# Patient Record
Sex: Female | Born: 1965 | Race: White | Hispanic: No | Marital: Married | Smoking: Never smoker
Health system: Southern US, Community
[De-identification: ages and names within clinical notes are randomized; demographics above are authoritative.]

## PROBLEM LIST (undated history)

## (undated) DIAGNOSIS — E669 Obesity, unspecified: Secondary | ICD-10-CM

## (undated) DIAGNOSIS — H9319 Tinnitus, unspecified ear: Secondary | ICD-10-CM

## (undated) DIAGNOSIS — F419 Anxiety disorder, unspecified: Secondary | ICD-10-CM

## (undated) DIAGNOSIS — H9190 Unspecified hearing loss, unspecified ear: Secondary | ICD-10-CM

## (undated) DIAGNOSIS — H8090 Unspecified otosclerosis, unspecified ear: Secondary | ICD-10-CM

## (undated) HISTORY — DX: Obesity, unspecified: E66.9

## (undated) HISTORY — DX: Anxiety disorder, unspecified: F41.9

---
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
## 1998-01-20 ENCOUNTER — Other Ambulatory Visit: Admission: RE | Admit: 1998-01-20 | Discharge: 1998-01-20 | Payer: Self-pay | Admitting: Obstetrics & Gynecology

## 1998-03-09 ENCOUNTER — Emergency Department (HOSPITAL_COMMUNITY): Admission: EM | Admit: 1998-03-09 | Discharge: 1998-03-09 | Payer: Self-pay | Admitting: Emergency Medicine

## 1998-05-15 HISTORY — PX: CHOLECYSTECTOMY: SHX55

## 1999-02-07 ENCOUNTER — Other Ambulatory Visit: Admission: RE | Admit: 1999-02-07 | Discharge: 1999-02-07 | Payer: Self-pay | Admitting: Obstetrics and Gynecology

## 1999-02-28 ENCOUNTER — Ambulatory Visit (HOSPITAL_COMMUNITY): Admission: RE | Admit: 1999-02-28 | Discharge: 1999-03-01 | Payer: Self-pay | Admitting: Surgery

## 1999-02-28 ENCOUNTER — Encounter: Payer: Self-pay | Admitting: Surgery

## 2000-01-30 ENCOUNTER — Other Ambulatory Visit: Admission: RE | Admit: 2000-01-30 | Discharge: 2000-01-30 | Payer: Self-pay | Admitting: Obstetrics and Gynecology

## 2001-03-21 ENCOUNTER — Other Ambulatory Visit: Admission: RE | Admit: 2001-03-21 | Discharge: 2001-03-21 | Payer: Self-pay | Admitting: Obstetrics and Gynecology

## 2001-05-08 ENCOUNTER — Emergency Department (HOSPITAL_COMMUNITY): Admission: EM | Admit: 2001-05-08 | Discharge: 2001-05-08 | Payer: Self-pay | Admitting: *Deleted

## 2002-04-09 ENCOUNTER — Other Ambulatory Visit: Admission: RE | Admit: 2002-04-09 | Discharge: 2002-04-09 | Payer: Self-pay | Admitting: Obstetrics and Gynecology

## 2003-06-03 ENCOUNTER — Other Ambulatory Visit: Admission: RE | Admit: 2003-06-03 | Discharge: 2003-06-03 | Payer: Self-pay | Admitting: Obstetrics and Gynecology

## 2004-07-11 ENCOUNTER — Other Ambulatory Visit: Admission: RE | Admit: 2004-07-11 | Discharge: 2004-07-11 | Payer: Self-pay | Admitting: Obstetrics and Gynecology

## 2005-07-13 ENCOUNTER — Other Ambulatory Visit: Admission: RE | Admit: 2005-07-13 | Discharge: 2005-07-13 | Payer: Self-pay | Admitting: Obstetrics and Gynecology

## 2005-09-13 ENCOUNTER — Encounter: Payer: Self-pay | Admitting: Emergency Medicine

## 2015-10-12 DIAGNOSIS — J019 Acute sinusitis, unspecified: Secondary | ICD-10-CM | POA: Diagnosis not present

## 2015-10-12 DIAGNOSIS — H6691 Otitis media, unspecified, right ear: Secondary | ICD-10-CM | POA: Diagnosis not present

## 2015-10-12 DIAGNOSIS — R030 Elevated blood-pressure reading, without diagnosis of hypertension: Secondary | ICD-10-CM | POA: Diagnosis not present

## 2015-10-14 DIAGNOSIS — H9201 Otalgia, right ear: Secondary | ICD-10-CM | POA: Diagnosis not present

## 2015-11-17 DIAGNOSIS — M722 Plantar fascial fibromatosis: Secondary | ICD-10-CM | POA: Diagnosis not present

## 2015-11-17 DIAGNOSIS — M7732 Calcaneal spur, left foot: Secondary | ICD-10-CM | POA: Diagnosis not present

## 2015-11-17 DIAGNOSIS — M76822 Posterior tibial tendinitis, left leg: Secondary | ICD-10-CM | POA: Diagnosis not present

## 2015-11-17 DIAGNOSIS — M71572 Other bursitis, not elsewhere classified, left ankle and foot: Secondary | ICD-10-CM | POA: Diagnosis not present

## 2016-03-28 DIAGNOSIS — R05 Cough: Secondary | ICD-10-CM | POA: Diagnosis not present

## 2016-03-28 DIAGNOSIS — J019 Acute sinusitis, unspecified: Secondary | ICD-10-CM | POA: Diagnosis not present

## 2016-07-17 DIAGNOSIS — Z124 Encounter for screening for malignant neoplasm of cervix: Secondary | ICD-10-CM | POA: Diagnosis not present

## 2016-07-17 DIAGNOSIS — Z130 Encounter for screening for diseases of the blood and blood-forming organs and certain disorders involving the immune mechanism: Secondary | ICD-10-CM | POA: Diagnosis not present

## 2016-07-17 DIAGNOSIS — Z1151 Encounter for screening for human papillomavirus (HPV): Secondary | ICD-10-CM | POA: Diagnosis not present

## 2016-07-17 DIAGNOSIS — Z1231 Encounter for screening mammogram for malignant neoplasm of breast: Secondary | ICD-10-CM | POA: Diagnosis not present

## 2016-07-17 DIAGNOSIS — Z01419 Encounter for gynecological examination (general) (routine) without abnormal findings: Secondary | ICD-10-CM | POA: Diagnosis not present

## 2016-07-17 DIAGNOSIS — Z3041 Encounter for surveillance of contraceptive pills: Secondary | ICD-10-CM | POA: Diagnosis not present

## 2016-07-17 DIAGNOSIS — Z1389 Encounter for screening for other disorder: Secondary | ICD-10-CM | POA: Diagnosis not present

## 2016-07-17 DIAGNOSIS — Z6835 Body mass index (BMI) 35.0-35.9, adult: Secondary | ICD-10-CM | POA: Diagnosis not present

## 2016-07-17 LAB — HM PAP SMEAR: HM Pap smear: NEGATIVE

## 2016-07-17 LAB — HM MAMMOGRAPHY

## 2017-06-21 ENCOUNTER — Encounter: Payer: Self-pay | Admitting: Medical

## 2017-06-21 ENCOUNTER — Ambulatory Visit (INDEPENDENT_AMBULATORY_CARE_PROVIDER_SITE_OTHER): Payer: BLUE CROSS/BLUE SHIELD | Admitting: Medical

## 2017-06-21 VITALS — BP 130/70 | HR 95 | Wt 193.4 lb

## 2017-06-21 DIAGNOSIS — L509 Urticaria, unspecified: Secondary | ICD-10-CM | POA: Diagnosis not present

## 2017-06-21 DIAGNOSIS — G4700 Insomnia, unspecified: Secondary | ICD-10-CM | POA: Diagnosis not present

## 2017-06-21 DIAGNOSIS — Z566 Other physical and mental strain related to work: Secondary | ICD-10-CM

## 2017-06-21 LAB — CBC WITH DIFFERENTIAL/PLATELET
Basophils Absolute: 0 10*3/uL (ref 0.0–0.2)
Basos: 0 %
EOS (ABSOLUTE): 0.2 10*3/uL (ref 0.0–0.4)
Eos: 3 %
Hematocrit: 40 % (ref 34.0–46.6)
Hemoglobin: 13.2 g/dL (ref 11.1–15.9)
Immature Grans (Abs): 0 10*3/uL (ref 0.0–0.1)
Immature Granulocytes: 0 %
Lymphocytes Absolute: 2.2 10*3/uL (ref 0.7–3.1)
Lymphs: 24 %
MCH: 30.1 pg (ref 26.6–33.0)
MCHC: 33 g/dL (ref 31.5–35.7)
MCV: 91 fL (ref 79–97)
Monocytes Absolute: 0.8 10*3/uL (ref 0.1–0.9)
Monocytes: 8 %
Neutrophils Absolute: 6 10*3/uL (ref 1.4–7.0)
Neutrophils: 65 %
Platelets: 371 10*3/uL (ref 150–379)
RBC: 4.38 x10E6/uL (ref 3.77–5.28)
RDW: 13.6 % (ref 12.3–15.4)
WBC: 9.2 10*3/uL (ref 3.4–10.8)

## 2017-06-21 MED ORDER — HYDROXYZINE HCL 10 MG PO TABS: 10.0000 mg | ORAL_TABLET | Freq: Two times a day (BID) | ORAL | 0 refills | Status: DC

## 2017-06-21 MED ORDER — TRIAMCINOLONE ACETONIDE 0.1 % EX CREA: 1.0000 "application " | TOPICAL_CREAM | Freq: Two times a day (BID) | CUTANEOUS | 0 refills | Status: DC

## 2017-06-21 NOTE — Progress Notes (Signed)
Subjective:  Cathy Bartlett is a 52 y.o. female who presents as a new patient.  She sees primarily her gynecologist, Dr. Senaida Ores, [facility]  Here for complaint of rash. For the past month has rash that breaks out on forearms.  Looks like whelps.  She has pictures of this.  She has no current rash.  Been havcing these mainly at work, but most days, but not on weekend when home. 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  She denies any particular triggers such as cleaning supplies or dyes or foods that work is different than at home.  Has tried some cortisone cream topically.  Several years ago had litle white bumps on forearms, and was given a cream sample.   That cleared up the white bumps, but she tried it on the current rash and it hasn't cleared up.   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  She finds herself worrying a lot more work home, having trouble sleeping because she is thinking about work all the time.  No prior similar. No other aggravating or relieving factors.    No other c/o.  The following portions of the patient's history were reviewed and updated as appropriate: allergies, current medications, past family history, past medical history, past social history, past surgical history and problem list.  ROS Otherwise as in subjective above   Objective: BP 130/70   Pulse 95   Wt 193 lb 6.4 oz (87.7 kg)   LMP 06/20/2017   SpO2 96%   General appearence: alert, no distress, WD/WN Skin: right neck with small patch of macular pink/red rash 1cm diameter and linear patch of similar macular rash on right forearm, but she showed a picture on her phone of hives on her forearms from a few days ago HEENT: normocephalic, sclerae anicteric, conjunctiva pink and moist, TMs pearly, nares patent, no discharge or erythema, pharynx normal Oral cavity: MMM, no  lesions Neck: supple, no lymphadenopathy, no thyromegaly, no masses Heart: RRR, normal S1, S2, no murmurs Lungs: CTA bilaterally, no wheezes, rhonchi, or rales Pulses: 2+ radial pulses, 2+ pedal pulses, normal cap refill Ext: no edema   Assessment: Encounter Diagnoses  Name Primary?  . Hives Yes  . Work-related stress   . Insomnia, unspecified type     Plan: Hives Begin Hydroxyzine 1 tablet, 2-3 times daily for the next few weeks for rash and itching.  Avoid allergen or trigger if you suspect a certain trigger is causing the rash.  You can use Triamcinolone topical cream twice daily for 1-2 weeks at a time for rash.   If not much improved within the next 3- 5 days, then recheck or call.  If not completely resolved within 2 weeks, call or recheck.    Insomnia Discussed sleep hygiene measures including regular sleep schedule, optimal sleep environment, and relaxing presleep rituals.  Recommended daily exercise.  Discussed stimulus control, avoiding daytime naps, avoiding caffeine after noon, avoiding excess alcohol, avoiding food or drink <2 hours before bedtime.    Work-related stress We discussed ways to deal with stress and anxiety.  I recommend regular exercise such as 30 minutes or more most days of the week such as walking running and bicycling  I recommend taking some time to meditate or pray daily to help slow racing thoughts.  I recommend working on relaxation techniques such as deep breathing exercises in a  comfortable position relaxing your body.  There are free Apps on the smart phone for this for example  Consider getting a massage  Journal or use diary to express your ideas on paper to cope with anxiety and stress  Work on time management, use a calendar or plan out things to avoid stressing about things. Find ways to utilize your time to include exercise and personal "me" time.  Some people use aromatherapy such as lavender to relax  Some people use herbal teas to  help calm their mood  Spend some time with animals or your pet if you have one  Consider seeing a counselor to help deal with anxiety and work on specific techniques    Cathy Fermoina was seen today for new patient (initial visit).  Diagnoses and all orders for this visit:  Hives -     CBC with Differential/Platelet  Work-related stress -     CBC with Differential/Platelet  Insomnia, unspecified type  Other orders -     hydrOXYzine (ATARAX/VISTARIL) 10 MG tablet; Take 1 tablet (10 mg total) by mouth 2 (two) times daily. -     triamcinolone cream (KENALOG) 0.1 %; Apply 1 application topically 2 (two) times daily.   Follow up: 1wk

## 2017-06-21 NOTE — Patient Instructions (Signed)
Insomnia Insomnia is frequent trouble falling and/or staying asleep. Insomnia can be a long term problem or a short term problem. Both are common. Insomnia can be a short term problem when the wakefulness is related to a certain stress or worry. Long term insomnia is often related to ongoing stress during waking hours and/or poor sleeping habits. Overtime, sleep deprivation itself can make the problem worse. Every little thing feels more severe because you are overtired and your ability to cope is decreased.  CAUSES   Stress, anxiety, and depression.  Poor sleeping habits.  Distractions such as TV in the bedroom.  Naps close to bedtime.  Engaging in emotionally charged conversations before bed.  Technical reading before sleep.  Alcohol and other sedatives. They may make the problem worse. They can hurt normal sleep patterns and normal dream activity.  Stimulants such as caffeine for several hours prior to bedtime.  Pain syndromes and shortness of breath can cause insomnia.  Exercise late at night.  Changing time zones may cause sleeping problems (jet lag).  It is sometimes helpful to have someone observe your sleeping patterns. They should look for periods of not breathing during the night (sleep apnea). They should also look to see how long those periods last. If you live alone or observers are uncertain, you can also be observed at a sleep clinic where your sleep patterns will be professionally monitored. Sleep apnea requires a checkup and treatment. Give your caregivers your medical history. Give your caregivers observations your family has made about your sleep.   SYMPTOMS   Not feeling rested in the morning.  Anxiety and restlessness at bedtime.  Difficulty falling and staying asleep.  TREATMENT   Your caregiver may prescribe treatment for an underlying medical disorders. Your caregiver can give advice or help if you are using alcohol or other drugs for self-medication.  Treatment of underlying problems will usually eliminate insomnia problems.  Medications can be prescribed for short time use. They are generally not recommended for lengthy use.  Over-the-counter sleep medicines are not recommended for lengthy use. They can be habit forming.  You can promote easier sleeping by making lifestyle changes such as the following:  Sleep hygiene  Sleep only as much as you need to feel rested and then get out of bed  Keep a regular sleep schedule.  Aim to go to bed at the same time every night, and set an alarm clock to wake up at a fixed time each morning including weekends  Develop a bedtime ritual. Keep a familiar routine of bathing, brushing your teeth, climbing into bed at the same time each night, listening to soothing music. Routines increase the success of falling to sleep faster.  Use relaxation techniques. This can be using breathing and muscle tension release routines. It can also include visualizing peaceful scenes. You can also help control troubling or intruding thoughts by keeping your mind occupied with boring or repetitive thoughts like the old concept of counting sheep. You can make it more creative like imagining planting one beautiful flower after another in your backyard garden.  During your day, work to eliminate stress. When this is not possible use some of the previous suggestions to help reduce the anxiety that accompanies stressful situations.  Avoid forcing sleep  Exercise regularly at least 20 minutes, preferably 4-5 hours before bedtime  Avoid caffeinated beverages after lunch  Avoid alcohol near bedtime; no "night cap"  Avoid smoking, especially in the evening  Do not go to bed  hungry; work on [employer] Pacificchanging your diet and the time of your last meal. No night time snacks.  Adjust bedroom environment to a cool, quiet, dark room  Deal with you worries before bedtime.  Consider counseling for excessive stress, worry, and life situations.   I can provide resources and contact information for counselors if needed.  Stimulus control  Go to bed only when sleepy  Do not watch television, read, eat, or worry while in bed.  Use bed only for sleep and sex  Stop tedious detailed work at least one hour before bedtime.  Get out of the bed if unable to fall asleep within 20 minutes and go to another room.  Return to bed only when sleepy.  Read or do some quiet activity. Keep the lights down. Wait until you feel sleepy and go back to bed.Repeat this step as many times as necessary throughout the night  Do not take a nap during the day   SLEEP DIARY   Keep a diary. Inform your caregiver about your progress. This includes any medication side effects. See your caregiver regularly. Take note of:  Times when you are asleep.  Times when you are awake during the night.  The quality of your sleep.  How you feel the next day.  This information will help your caregiver care for you.  Bring your sleep diary in at the next visit      Hives Hives (urticaria) are itchy, red, swollen areas on your skin. Hives can show up on any part of your body, and they can vary in size. They can be as small as the tip of a pen or much larger. Hives often fade within 24 hours (acute hives). In other cases, new hives show up after old ones fade. This can continue for many days or weeks (chronic hives). Hives are caused by your body's reaction to an irritant or to something that you are allergic to (trigger). You can get hives right after being around a trigger or hours later. Hives do not spread from person to person (are not contagious). Hives may get worse if you scratch them, if you exercise, or if you have worries (emotional stress). Follow these instructions at home: Medicines  Take or apply over-the-counter and prescription medicines only as told by your doctor.  If you were prescribed an antibiotic medicine, use it as told by your doctor. Do not  stop taking the antibiotic even if you start to feel better. Skin Care  Apply cool, wet cloths (cool compresses) to the itchy, red, swollen areas.  Do not scratch your skin. Do not rub your skin. General instructions  Do not take hot showers or baths. This can make itching worse.  Do not wear tight clothes.  Use sunscreen and wear clothing that covers your skin when you are outside.  Avoid any triggers that cause your hives. Keep a journal to help you keep track of what causes your hives. Write down: ? What medicines you take. ? What you eat and drink. ? What products you use on your skin.  Keep all follow-up visits as told by your doctor. This is important. Contact a doctor if:  Your symptoms are not better with medicine.  Your joints are painful or swollen. Get help right away if:  You have a fever.  You have belly pain.  Your tongue or lips are swollen.  Your eyelids are swollen.  Your chest or throat feels tight.  You have trouble breathing or  swallowing. These symptoms may be an emergency. Do not wait to see if the symptoms will go away. Get medical help right away. Call your local emergency services (911 in the [place] Do not drive yourself to the hospital. This information is not intended to replace advice given to you by your health care provider. Make sure you discuss any questions you have with your health care provider. Document Released: 02/08/2008 Document Revised: 10/07/2015 Document Reviewed: 02/17/2015 Elsevier Interactive Patient Education  2018 [employer].

## 2017-06-21 NOTE — Assessment & Plan Note (Addendum)
Discussed sleep hygiene measures including regular sleep schedule, optimal sleep environment, and relaxing presleep rituals.  Recommended daily exercise.  Discussed stimulus control, avoiding daytime naps, avoiding caffeine after noon, avoiding excess alcohol, avoiding food or drink <2 hours before bedtime.    

## 2017-06-21 NOTE — Assessment & Plan Note (Addendum)
We discussed ways to deal with stress and anxiety. I recommend regular exercise such as 30 minutes or more most days of the week such as walking running and bicycling I recommend taking some time to meditate or pray daily to help slow racing thoughts. I recommend working on relaxation techniques such as deep breathing exercises in a comfortable position relaxing your body.  There are free Apps on the smart phone for this for example Consider getting a massage Journal or use diary to express your ideas on paper to cope with anxiety and stress Work on time management, use a calendar or plan out things to avoid stressing about things. Find ways to utilize your time to include exercise and personal "me" time. Some people use aromatherapy such as lavender to relax Some people use herbal teas to help calm their mood Spend some time with animals or your pet if you have one Consider seeing a counselor to help deal with anxiety and work on specific techniques 

## 2017-06-21 NOTE — Assessment & Plan Note (Signed)
Begin Hydroxyzine 1 tablet, 2-3 times daily for the next few weeks for rash and itching.  Avoid allergen or trigger if you suspect a certain trigger is causing the rash.  You can use Triamcinolone topical cream twice daily for 1-2 weeks at a time for rash.   If not much improved within the next 3- 5 days, then recheck or call.  If not completely resolved within 2 weeks, call or recheck.

## 2017-07-05 ENCOUNTER — Encounter: Payer: Self-pay | Admitting: Medical

## 2017-07-06 ENCOUNTER — Other Ambulatory Visit: Payer: Self-pay | Admitting: Medical

## 2017-07-06 MED ORDER — HYDROXYZINE HCL 10 MG PO TABS: 10.0000 mg | ORAL_TABLET | Freq: Two times a day (BID) | ORAL | 1 refills | Status: DC

## 2017-08-10 DIAGNOSIS — Z1231 Encounter for screening mammogram for malignant neoplasm of breast: Secondary | ICD-10-CM | POA: Diagnosis not present

## 2017-08-10 LAB — HM MAMMOGRAPHY

## 2017-08-21 DIAGNOSIS — Z1389 Encounter for screening for other disorder: Secondary | ICD-10-CM | POA: Diagnosis not present

## 2017-08-21 DIAGNOSIS — Z6833 Body mass index (BMI) 33.0-33.9, adult: Secondary | ICD-10-CM | POA: Diagnosis not present

## 2017-08-21 DIAGNOSIS — Z01419 Encounter for gynecological examination (general) (routine) without abnormal findings: Secondary | ICD-10-CM | POA: Diagnosis not present

## 2017-08-21 DIAGNOSIS — Z130 Encounter for screening for diseases of the blood and blood-forming organs and certain disorders involving the immune mechanism: Secondary | ICD-10-CM | POA: Diagnosis not present

## 2017-10-09 ENCOUNTER — Encounter: Payer: Self-pay | Admitting: Family Medicine

## 2017-10-09 ENCOUNTER — Ambulatory Visit (INDEPENDENT_AMBULATORY_CARE_PROVIDER_SITE_OTHER): Payer: BLUE CROSS/BLUE SHIELD | Admitting: Family Medicine

## 2017-10-09 VITALS — BP 122/86 | HR 98 | Temp 99.1°F | Ht 66.0 in | Wt 202.0 lb

## 2017-10-09 DIAGNOSIS — B349 Viral infection, unspecified: Secondary | ICD-10-CM

## 2017-10-09 NOTE — Progress Notes (Signed)
   Subjective:    Patient ID: Cathy Bartlett, female    DOB: 10-17-1965, 52 y.o.   MRN: [id number]  HPI She complains of a 1 day history of started with cough, headache, pain behind the eye, fever, chills , malaise and fatigue.  No sore throat earache, sneezing.   Review of Systems     Objective:   Physical Exam Alert and in no distress. Tympanic membranes and canals are normal. Pharyngeal area is normal. Neck is supple without adenopathy or thyromegaly. Cardiac exam shows a regular sinus rhythm without murmurs or gallops. Lungs are clear to auscultation. Flu test negative       Assessment & Plan:  Viral syndrome  Recommended treating with Advil or Aleve.  If her symptoms worsen in the next day or 2, she is to call.  This did hit relatively quickly so must also be concerned about pneumococcal disease.

## 2017-10-10 ENCOUNTER — Telehealth: Payer: Self-pay

## 2017-10-10 DIAGNOSIS — R05 Cough: Secondary | ICD-10-CM | POA: Diagnosis not present

## 2017-10-10 DIAGNOSIS — J019 Acute sinusitis, unspecified: Secondary | ICD-10-CM | POA: Diagnosis not present

## 2017-10-10 MED ORDER — AZITHROMYCIN 500 MG PO TABS: 500.0000 mg | ORAL_TABLET | Freq: Every day | ORAL | 0 refills | Status: DC

## 2017-10-10 NOTE — Telephone Encounter (Signed)
Pt has got the abx but say nyquil is not working. Just FYI. KH

## 2017-10-10 NOTE — Telephone Encounter (Signed)
Patient called states that she is worse, still has fever, cough is worse and she has not had any sleep.  Cough is making chest hurt and burn taking medicine every 4 hours with no relief.  Can you please send something else in to [facility].

## 2017-10-10 NOTE — Telephone Encounter (Signed)
I called an antibiotic in.  Make sure she is taking NyQuil at night

## 2018-02-20 ENCOUNTER — Ambulatory Visit (INDEPENDENT_AMBULATORY_CARE_PROVIDER_SITE_OTHER): Payer: BLUE CROSS/BLUE SHIELD | Admitting: Medical

## 2018-02-20 VITALS — BP 120/74 | HR 73 | Temp 98.1°F | Resp 16 | Ht 65.0 in | Wt 209.8 lb

## 2018-02-20 DIAGNOSIS — H68012 Acute Eustachian salpingitis, left ear: Secondary | ICD-10-CM

## 2018-02-20 DIAGNOSIS — Z7189 Other specified counseling: Secondary | ICD-10-CM

## 2018-02-20 DIAGNOSIS — Z1211 Encounter for screening for malignant neoplasm of colon: Secondary | ICD-10-CM

## 2018-02-20 DIAGNOSIS — Z7185 Encounter for immunization safety counseling: Secondary | ICD-10-CM

## 2018-02-20 DIAGNOSIS — H9312 Tinnitus, left ear: Secondary | ICD-10-CM | POA: Diagnosis not present

## 2018-02-20 NOTE — Progress Notes (Signed)
Subjective: Chief Complaint  Patient presents with  . ringing in ear    left ear ringing X 1 week   Here ringing in left ear x 1 week, wont stop. no recent injury or trauma.   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   Work in [employer].   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   No recent URI symptoms.  Having some sneezing, some head congestion.  Currently no cough, no sore, no runny nose, no ear pain.    No dizziness's.  No hearing loss.    No prior similar.   Using nothing for symptoms.  No other aggravating or relieving factors. No other complaint.  No past medical history on file. Current Outpatient Medications on File Prior to Visit  Medication Sig Dispense Refill  . hydrOXYzine (ATARAX/VISTARIL) 10 MG tablet Take 1 tablet (10 mg total) by mouth 2 (two) times daily. 45 tablet 1  . ibuprofen (ADVIL,MOTRIN) 100 MG tablet Take 100 mg by mouth every 6 (six) hours as needed for fever.    . Multiple Vitamin (MULTIVITAMIN) capsule Take 1 capsule by mouth daily.    Marland Kitchen thiamine (VITAMIN B-1) 100 MG tablet Take 100 mg by mouth daily.    Marland Kitchen triamcinolone cream (KENALOG) 0.1 % Apply 1 application topically 2 (two) times daily. (Patient not taking: Reported on 02/20/2018) 30 g 0  . VITAMIN D, CHOLECALCIFEROL, PO Take by mouth.     No current facility-administered medications on file prior to visit.    ROS as in subjective   Objective: BP 120/74   Pulse 73   Temp 98.1 F (36.7 C) (Oral)   Resp 16   Ht 5\' 5"  (1.651 m)   Wt 209 lb 12.8 oz (95.2 kg)   SpO2 97%   BMI 34.91 kg/m   General appearance: alert, no distress, WD/WN,  HEENT: normocephalic, sclerae anicteric, left TM with serous fluid behind the TM, right TM and canal normal,, nares patent, no discharge or erythema, pharynx normal Oral cavity: MMM, no lesions Neck: supple, no lymphadenopathy, no thyromegaly, no masses Heart: RRR, normal S1, S2, no murmurs Lungs: CTA bilaterally, no wheezes, rhonchi, or rales Pulses: 2+ symmetric,  upper and lower extremities, normal cap refill Neuro: CN II through XII intact, nonfocal exam   Assessment: Encounter Diagnoses  Name Primary?  . Tinnitus of left ear Yes  . Eustachian salpingitis, acute, left      Plan: Discussed exam findings and symptoms.  Advise she could either use Sudafed 1 or 2 times daily or Zyrtec daily for the next week, hydrate well, and if not much improved within 4 to 5 days let me know  Advised she return soon for a physical as we have not seen her for preventive care visit.  Advise she check insurance coverage for colonoscopy versus Cologuard and Shingrix vaccine  Cathy Bartlett was seen today for ringing in ear.  Diagnoses and all orders for this visit:  Tinnitus of left ear  Eustachian salpingitis, acute, left

## 2018-02-20 NOTE — Patient Instructions (Signed)
Consider Cologuard vs Colonoscopy, check insurance coverage for both  Shingles vaccine:  I recommend you have a shingles vaccine to help prevent shingles or herpes zoster outbreak.   Please call your insurer to inquire about coverage for the Shingrix vaccine given in 2 doses.   Some insurers cover this vaccine after age 50, some cover this after age 13.  If your insurer covers this, then call to schedule appointment to have this vaccine here.  Return soon for physical

## 2018-03-06 ENCOUNTER — Telehealth: Payer: Self-pay | Admitting: Medical

## 2018-03-06 ENCOUNTER — Other Ambulatory Visit: Payer: Self-pay

## 2018-03-06 DIAGNOSIS — H9312 Tinnitus, left ear: Secondary | ICD-10-CM

## 2018-03-06 NOTE — Telephone Encounter (Signed)
Refer to ear nose and throat specialist for chronic ringing in the ear

## 2018-03-06 NOTE — Telephone Encounter (Signed)
Done faxed notes to Dr Suszanne Conners

## 2018-03-21 DIAGNOSIS — J343 Hypertrophy of nasal turbinates: Secondary | ICD-10-CM | POA: Diagnosis not present

## 2018-03-21 DIAGNOSIS — H9042 Sensorineural hearing loss, unilateral, left ear, with unrestricted hearing on the contralateral side: Secondary | ICD-10-CM | POA: Diagnosis not present

## 2018-03-21 DIAGNOSIS — H9012 Conductive hearing loss, unilateral, left ear, with unrestricted hearing on the contralateral side: Secondary | ICD-10-CM | POA: Diagnosis not present

## 2018-03-21 DIAGNOSIS — H9312 Tinnitus, left ear: Secondary | ICD-10-CM | POA: Diagnosis not present

## 2018-03-21 DIAGNOSIS — H8092 Unspecified otosclerosis, left ear: Secondary | ICD-10-CM | POA: Diagnosis not present

## 2018-04-04 DIAGNOSIS — H90A32 Mixed conductive and sensorineural hearing loss, unilateral, left ear with restricted hearing on the contralateral side: Secondary | ICD-10-CM | POA: Diagnosis not present

## 2018-04-04 DIAGNOSIS — H90A21 Sensorineural hearing loss, unilateral, right ear, with restricted hearing on the contralateral side: Secondary | ICD-10-CM | POA: Diagnosis not present

## 2018-05-15 DIAGNOSIS — H9190 Unspecified hearing loss, unspecified ear: Secondary | ICD-10-CM

## 2018-05-15 DIAGNOSIS — H9319 Tinnitus, unspecified ear: Secondary | ICD-10-CM

## 2018-05-15 DIAGNOSIS — H8090 Unspecified otosclerosis, unspecified ear: Secondary | ICD-10-CM

## 2018-05-15 HISTORY — PX: INNER EAR SURGERY: SHX679

## 2018-05-15 HISTORY — DX: Unspecified hearing loss, unspecified ear: H91.90

## 2018-05-15 HISTORY — DX: Tinnitus, unspecified ear: H93.19

## 2018-05-15 HISTORY — DX: Unspecified otosclerosis, unspecified ear: H80.90

## 2018-05-24 ENCOUNTER — Other Ambulatory Visit: Payer: Self-pay | Admitting: Otolaryngology

## 2019-01-13 ENCOUNTER — Encounter: Payer: Self-pay | Admitting: Medical

## 2019-01-13 ENCOUNTER — Other Ambulatory Visit: Payer: Self-pay

## 2019-01-13 ENCOUNTER — Ambulatory Visit (INDEPENDENT_AMBULATORY_CARE_PROVIDER_SITE_OTHER): Payer: 59 | Admitting: Medical

## 2019-01-13 VITALS — BP 120/70 | HR 84 | Temp 98.1°F | Ht 65.0 in | Wt 216.8 lb

## 2019-01-13 DIAGNOSIS — Z23 Encounter for immunization: Secondary | ICD-10-CM | POA: Insufficient documentation

## 2019-01-13 DIAGNOSIS — Z7185 Encounter for immunization safety counseling: Secondary | ICD-10-CM

## 2019-01-13 DIAGNOSIS — E669 Obesity, unspecified: Secondary | ICD-10-CM | POA: Diagnosis not present

## 2019-01-13 DIAGNOSIS — Z7189 Other specified counseling: Secondary | ICD-10-CM

## 2019-01-13 DIAGNOSIS — Z0000 Encounter for general adult medical examination without abnormal findings: Secondary | ICD-10-CM | POA: Diagnosis not present

## 2019-01-13 DIAGNOSIS — F430 Acute stress reaction: Secondary | ICD-10-CM | POA: Insufficient documentation

## 2019-01-13 DIAGNOSIS — Z8249 Family history of ischemic heart disease and other diseases of the circulatory system: Secondary | ICD-10-CM | POA: Diagnosis not present

## 2019-01-13 DIAGNOSIS — L509 Urticaria, unspecified: Secondary | ICD-10-CM

## 2019-01-13 DIAGNOSIS — H9190 Unspecified hearing loss, unspecified ear: Secondary | ICD-10-CM

## 2019-01-13 DIAGNOSIS — H8090 Unspecified otosclerosis, unspecified ear: Secondary | ICD-10-CM

## 2019-01-13 DIAGNOSIS — Z1211 Encounter for screening for malignant neoplasm of colon: Secondary | ICD-10-CM | POA: Diagnosis not present

## 2019-01-13 NOTE — Patient Instructions (Signed)
Thanks for trusting us with your health care and for coming in for a physical today.  Below are some general recommendations I have for you:  Yearly screenings See your eye doctor yearly for routine vision care. See your dentist yearly for routine dental care including hygiene visits twice yearly. See me here yearly for a routine physical and preventative care visit   Cancer screening Colon cancer screening:   We will refer you for screening colonoscopy   Breast cancer screening -  Continue plan for yearly mammogram   Osteoporosis screening/Bone Density test - I recommend a bone density test if you are over 51 years old, or under 51 years old if you have risk factors such as a bone fracture as an adult, parent with history of bone fracture as adult, thyroid disease, early menopause, or underweight for example.   Specific Concerns today:  . We will call with lab results . I recommend a low carb health diet, exercise at least 150 minutes per week    Please follow up yearly for a physical.    I have included other useful information below for your review.  Preventative Care for Adults - Female      MAINTAIN REGULAR HEALTH EXAMS:  A routine yearly physical is a good way to check in with your primary care provider about your health and preventive screening. It is also an opportunity to share updates about your health and any concerns you have, and receive a thorough all-over exam.   Most health insurance companies pay for at least some preventative services.  Check with your health plan for specific coverages.  WHAT PREVENTATIVE SERVICES DO WOMEN NEED?  Adult women should have their weight and blood pressure checked regularly.   Women age 51 and older should have their cholesterol levels checked regularly.  Women should be screened for cervical cancer with a Pap smear and pelvic exam beginning at either age 51, or 3 years after they become sexually activity.    Breast  cancer screening generally begins at age 51 with a mammogram and breast exam by your primary care provider.    Beginning at age 51 and continuing to age 51, women should be screened for colorectal cancer.  Certain people may need continued testing until age 51.  Updating vaccinations is part of preventative care.  Vaccinations help protect against diseases such as the flu.  Osteoporosis is a disease in which the bones lose minerals and strength as we age. Women ages 665 and over should discuss this with their caregivers, as should women after menopause who have other risk factors.  Lab tests are generally done as part of preventative care to screen for anemia and blood disorders, to screen for problems with the kidneys and liver, to screen for bladder problems, to check blood sugar, and to check your cholesterol level.  Preventative services generally include counseling about diet, exercise, avoiding tobacco, drugs, excessive alcohol consumption, and sexually transmitted infections.    GENERAL RECOMMENDATIONS FOR GOOD HEALTH:  Healthy diet:  Eat a variety of foods, including fruit, vegetables, animal or vegetable protein, such as meat, fish, chicken, and eggs, or beans, lentils, tofu, and grains, such as rice.  Drink plenty of water daily.  Decrease saturated fat in the diet, avoid lots of red meat, processed foods, sweets, fast foods, and fried foods.  Exercise:  Aerobic exercise helps maintain good heart health. At least 30-40 minutes of moderate-intensity exercise is recommended. For example, a brisk walk that increases  your heart rate and breathing. This should be done on most days of the week.   Find a type of exercise or a variety of exercises that you enjoy so that it becomes a part of your daily life.  Examples are running, walking, swimming, water aerobics, and biking.  For motivation and support, explore group exercise such as aerobic class, spin class, Zumba, Yoga,or  martial  arts, etc.    Set exercise goals for yourself, such as a certain weight goal, walk or run in a race such as a 5k walk/run.  Speak to your primary care provider about exercise goals.  Disease prevention:  If you smoke or chew tobacco, find out from your caregiver how to quit. It can literally save your life, no matter how long you have been a tobacco user. If you do not use tobacco, never begin.   Maintain a healthy diet and normal weight. Increased weight leads to problems with blood pressure and diabetes.   The Body Mass Index or BMI is a way of measuring how much of your body is fat. Having a BMI above 27 increases the risk of heart disease, diabetes, hypertension, stroke and other problems related to obesity. Your caregiver can help determine your BMI and based on it develop an exercise and dietary program to help you achieve or maintain this important measurement at a healthful level.  High blood pressure causes heart and blood vessel problems.  Persistent high blood pressure should be treated with medicine if weight loss and exercise do not work.   Fat and cholesterol leaves deposits in your arteries that can block them. This causes heart disease and vessel disease elsewhere in your body.  If your cholesterol is found to be high, or if you have heart disease or certain other medical conditions, then you may need to have your cholesterol monitored frequently and be treated with medication.   Ask if you should have a cardiac stress test if your history suggests this. A stress test is a test done on a treadmill that looks for heart disease. This test can find disease prior to there being a problem.  Menopause can be associated with physical symptoms and risks. Hormone replacement therapy is available to decrease these. You should talk to your caregiver about whether starting or continuing to take hormones is right for you.   Osteoporosis is a disease in which the bones lose minerals and  strength as we age. This can result in serious bone fractures. Risk of osteoporosis can be identified using a bone density scan. Women ages 71 and over should discuss this with their caregivers, as should women after menopause who have other risk factors. Ask your caregiver whether you should be taking a calcium supplement and Vitamin D, to reduce the rate of osteoporosis.   Avoid drinking alcohol in excess (more than two drinks per day).  Avoid use of street drugs. Do not share needles with anyone. Ask for professional help if you need assistance or instructions on stopping the use of alcohol, cigarettes, and/or drugs.  Brush your teeth twice a day with fluoride toothpaste, and floss once a day. Good oral hygiene prevents tooth decay and gum disease. The problems can be painful, unattractive, and can cause other health problems. Visit your dentist for a routine oral and dental check up and preventive care every 6-12 months.   Look at your skin regularly.  Use a mirror to look at your back. Notify your caregivers of changes in moles,  especially if there are changes in shapes, colors, a size larger than a pencil eraser, an irregular border, or development of new moles.  Safety:  Use seatbelts 100% of the time, whether driving or as a passenger.  Use safety devices such as hearing protection if you work in environments with loud noise or significant background noise.  Use safety glasses when doing any work that could send debris in to the eyes.  Use a helmet if you ride a bike or motorcycle.  Use appropriate safety gear for contact sports.  Talk to your caregiver about gun safety.  Use sunscreen with a SPF (or skin protection factor) of 15 or greater.  Lighter skinned people are at a greater risk of skin cancer. Don't forget to also wear sunglasses in order to protect your eyes from too much damaging sunlight. Damaging sunlight can accelerate cataract formation.   Practice safe sex. Use condoms. Condoms  are used for birth control and to help reduce the spread of sexually transmitted infections (or STIs).  Some of the STIs are gonorrhea (the clap), chlamydia, syphilis, trichomonas, herpes, HPV (human papilloma virus) and HIV (human immunodeficiency virus) which causes AIDS. The herpes, HIV and HPV are viral illnesses that have no cure. These can result in disability, cancer and death.   Keep carbon monoxide and smoke detectors in your home functioning at all times. Change the batteries every 6 months or use a model that plugs into the wall.   Vaccinations:  Stay up to date with your tetanus shots and other required immunizations. You should have a booster for tetanus every 10 years. Be sure to get your flu shot every year, since 5%-20% of the [place] population comes down with the flu. The flu vaccine changes each year, so being vaccinated once is not enough. Get your shot in the fall, before the flu season peaks.   Other vaccines to consider:  Human Papilloma Virus or HPV causes cancer of the cervix, and other infections that can be transmitted from person to person. There is a vaccine for HPV, and females should get immunized between the ages of 29 and 35. It requires a series of 3 shots.   Pneumococcal vaccine to protect against certain types of pneumonia.  This is normally recommended for adults age 20 or older.  However, adults younger than 51 years old with certain underlying conditions such as diabetes, heart or lung disease should also receive the vaccine.  Shingles vaccine to protect against Varicella Zoster if you are older than age 85, or younger than 51 years old with certain underlying illness.  If you have not had the Shingrix vaccine, please call your insurer to inquire about coverage for the Shingrix vaccine given in 2 doses.   Some insurers cover this vaccine after age 5, some cover this after age 61.  If your insurer covers this, then call to schedule appointment to have this vaccine  here  Hepatitis A vaccine to protect against a form of infection of the liver by a virus acquired from food.  Hepatitis B vaccine to protect against a form of infection of the liver by a virus acquired from blood or body fluids, particularly if you work in health care.  If you plan to travel internationally, check with your local health department for specific vaccination recommendations.  Cancer Screening:  Breast cancer screening is essential to preventive care for women. All women age 41 and older should perform a breast self-exam every month. At age 21  and older, women should have their caregiver complete a breast exam each year. Women at ages 6240 and older should have a mammogram (x-ray film) of the breasts. Your caregiver can discuss how often you need mammograms.    Cervical cancer screening includes taking a Pap smear (sample of cells examined under a microscope) from the cervix (end of the uterus). It also includes testing for HPV (Human Papilloma Virus, which can cause cervical cancer). Screening and a pelvic exam should begin at age 51, or 3 years after a woman becomes sexually active. Screening should occur every year, with a Pap smear but no HPV testing, up to age 230. After age 51, you should have a Pap smear every 3 years with HPV testing, if no HPV was found previously.   Most routine colon cancer screening begins at the age of 51. On a yearly basis, doctors may provide special easy to use take-home tests to check for hidden blood in the stool. Sigmoidoscopy or colonoscopy can detect the earliest forms of colon cancer and is life saving. These tests use a small camera at the end of a tube to directly examine the colon. Speak to your caregiver about this at age 51, when routine screening begins (and is repeated every 5 years unless early forms of pre-cancerous polyps or small growths are found).

## 2019-01-13 NOTE — Progress Notes (Signed)
Subjective:   HPI  Cathy Bartlett is a 53 y.o. female who presents for Chief Complaint  Patient presents with  . Annual Exam    with fasting labs     Patient Care Team: Tysinger, Cleda Mccreedyavid S, PA-C as PCP - General (Family Medicine) Sees dentist Sees eye doctor Dr[facility] Huel CoteKathy Richardson, [facility]. 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    Been under a lot of stress.  Very emotional lately.    Itching everywhere, has hx/o hives.    Reviewed their medical, surgical, family, social, medication, and allergy history and updated chart as appropriate.  Past Medical History:  Diagnosis Date  . Hearing loss 2020  . Obese   . 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 Tinnitus 2020    Past Surgical History:  Procedure Laterality Date  . CHOLECYSTECTOMY  2000  . INNER EAR SURGERY  05/2018   tinnitus, hearing loss, due to otosclerosis; Dr. Christia Readingwight Bates    Social History   Socioeconomic History  . Marital status: Married    Spouse name: Not on file  . Number of children: Not on file  . Years of education: Not on file  . Highest education level: Not on file  Occupational History  . Not on file  Social Needs  . Financial resource strain: Not on file  . Food insecurity    Worry: Not on file    Inability: Not on file  . Transportation needs    Medical: Not on file    Non-medical: Not on file  Tobacco Use  . Smoking status: Never Smoker  . Smokeless tobacco: Never Used  Substance and Sexual Activity  . Alcohol use: No    Frequency: Never  . Drug use: Not on file  . Sexual activity: Not on file  Lifestyle  . Physical activity    Days per week: Not on file    Minutes per session: Not on file  . Stress: Not on file  Relationships  . Social Musicianconnections    Talks on phone: Not on file    Gets together: Not on file    Attends religious service: Not on file    Active member of club or organization: Not on file    Attends  meetings of clubs or organizations: Not on file    Relationship status: Not on file  . Intimate partner violence    Fear of current or ex partner: Not on file    Emotionally abused: Not on file    Physically abused: Not on file    Forced sexual activity: Not on file  Other Topics Concern  . Not on file  Social History Narrative   Lives with husband Onalee HuaDavid, patient of mine as well, no children.   Works as Occupational psychologistfulfillment supervisor.   Exercise - some yoga, walking.   12/2018    Family History  Problem Relation Age of Onset  . Hypertension Mother   . Diabetes Mother   . Dementia Mother        died of dementia  . Heart disease Mother [id number]       MI shortly after husband died  . Heart disease Father [id number]       multiple MIs, CABG  . Hypertension Father   . Stroke Father   . Diabetes Father   . Diabetes Sister   . Supraventricular tachycardia Sister   . Cancer Maternal Aunt  breast  . Cancer Maternal Grandfather   . Cancer Maternal Aunt        breast     Current Outpatient Medications:  .  ibuprofen (ADVIL,MOTRIN) 100 MG tablet, Take 100 mg by mouth every 6 (six) hours as needed for fever., Disp: , Rfl:  .  Multiple Vitamin (MULTIVITAMIN) capsule, Take 1 capsule by mouth daily., Disp: , Rfl:  .  thiamine (VITAMIN B-1) 100 MG tablet, Take 100 mg by mouth daily., Disp: , Rfl:  .  triamcinolone cream (KENALOG) 0.1 %, Apply 1 application topically 2 (two) times daily., Disp: 30 g, Rfl: 0 .  hydrOXYzine (ATARAX/VISTARIL) 10 MG tablet, Take 1 tablet (10 mg total) by mouth 2 (two) times daily. (Patient not taking: Reported on 01/13/2019), Disp: 45 tablet, Rfl: 1 .  VITAMIN D, CHOLECALCIFEROL, PO, Take by mouth., Disp: , Rfl:   No Known Allergies    Review of Systems Constitutional: -fever, -chills, -sweats, -unexpected weight change, -decreased appetite, -fatigue Allergy: -sneezing, -itching, -congestion Dermatology: -changing moles, +rash, -lumps ENT: -runny nose, -ear pain, -sore  throat, -hoarseness, -sinus pain, -teeth pain, - ringing in ears, -hearing loss, -nosebleeds Cardiology: -chest pain, -palpitations, -swelling, -difficulty breathing when lying flat, -waking up short of breath Respiratory: -cough, -shortness of breath, -difficulty breathing with exercise or exertion, -wheezing, -coughing up blood Gastroenterology: -abdominal pain, -nausea, -vomiting, -diarrhea, -constipation, -blood in stool, -changes in bowel movement, -difficulty swallowing or eating Hematology: -bleeding, -bruising  Musculoskeletal: -joint aches, -muscle aches, -joint swelling, -back pain, -neck pain, -cramping, -changes in gait Ophthalmology: denies vision changes, eye redness, itching, discharge Urology: -burning with urination, -difficulty urinating, -blood in urine, -urinary frequency, -urgency, -incontinence Neurology: -headache, -weakness, -tingling, -numbness, -memory loss, -falls, -dizziness Breast/gyn: -breast tendnerss, -discharge, -lumps, -vaginal discharge,- irregular periods, -heavy periods      Objective:  BP 120/70   Pulse 84   Temp 98.1 F (36.7 C) (Oral)   Ht 5\' 5"  (1.651 m)   Wt 216 lb 12.8 oz (98.3 kg)   SpO2 98%   BMI 36.08 kg/m   General appearance: alert, no distress, WD/WN, Caucasian female Skin: large flat brown macule upper mid back 1.3 cm diameter, palpation suggest some thickness or fullness subcutaneous to the skin, other scattered macules, no other worrisome lesions HEENT: normocephalic, conjunctiva/corneas normal, sclerae anicteric, PERRLA, EOMi, nares patent, no discharge or erythema, pharynx normal Oral cavity: MMM, tongue normal, teeth normal Neck: supple, no lymphadenopathy, no thyromegaly, no masses, normal ROM, no bruits Chest: non tender, normal shape and expansion Heart: RRR, normal S1, S2, no murmurs Lungs: CTA bilaterally, no wheezes, rhonchi, or rales Abdomen: +bs, soft, non tender, non distended, no masses, no hepatomegaly, no splenomegaly,  no bruits Back: non tender, normal ROM, no scoliosis Musculoskeletal: upper extremities non tender, no obvious deformity, normal ROM throughout, lower extremities non tender, no obvious deformity, normal ROM throughout Extremities: no edema, no cyanosis, no clubbing Pulses: 2+ symmetric, upper and lower extremities, normal cap refill Neurological: alert, oriented x 3, CN2-12 intact, strength normal upper extremities and lower extremities, sensation normal throughout, DTRs 2+ throughout, no cerebellar signs, gait normal Psychiatric: normal affect, behavior normal, pleasant  Breast/gyn/rectal - deferred to gynecology   EKG Indication physical, family history of premature heart disease in father, rate 91 bpm, PR interval 110 ms, QRS 82 ms, QTC 455 ms, axis VI degrees, sinus rhythm with short PR interval   Assessment and Plan :   Encounter Diagnoses  Name Primary?  . Encounter for health maintenance examination  in adult Yes  . Family history of premature CAD   . Obesity, unspecified classification, unspecified obesity type, unspecified whether serious comorbidity present   . Screen for colon cancer   . Vaccine counseling   . Otosclerosis, unspecified laterality   . Hearing loss, unspecified hearing loss type, unspecified laterality   . Need for influenza vaccination   . Need for Tdap vaccination   . Hives   . Acute stress reaction     Physical exam - discussed and counseled on healthy lifestyle, diet, exercise, preventative care, vaccinations, sick and well care, proper use of emergency dept and after hours care, and addressed their concerns.    Health screening: Advised they see their eye doctor yearly for routine vision care. Advised they see their dentist yearly for routine dental care including hygiene visits twice yearly.  Cancer screening Counseled on self breast exams, mammograms, cervical cancer screening  Colonoscopy:  Referred for screening  colonoscopy  Vaccinations: Advised yearly influenza vaccine Counseled on the influenza virus vaccine.  Vaccine information sheet given.  Influenza vaccine given after consent obtained.  Counseled on the Tdap (tetanus, diptheria, and acellular pertussis) vaccine.  Vaccine information sheet given. Tdap vaccine given after consent obtained.   Separate significant chronic issues discussed: Hives - likely stress related  Acute stress reaction - discussed ways to deal with stress.  Consider counseling.   She may request sleep aid, having some problems with sleep.  Skin lesions - advised baseline dermatology consult.  She will let me know.  Otosclerosis - reviewed ENT notes from earlier this year  Obesity - counseled on need to work on weight loss, healthy diet   Allisson was seen today for annual exam.  Diagnoses and all orders for this visit:  Encounter for health maintenance examination in adult -     Comprehensive metabolic panel -     CBC with Differential/Platelet -     Lipid panel -     TSH -     VITAMIN D 25 Hydroxy (Vit-D Deficiency, Fractures) -     EKG 12-Lead -     Ambulatory referral to Gastroenterology  Family history of premature CAD -     EKG 12-Lead  Obesity, unspecified classification, unspecified obesity type, unspecified whether serious comorbidity present  Screen for colon cancer -     Ambulatory referral to Gastroenterology  Vaccine counseling  Otosclerosis, unspecified laterality  Hearing loss, unspecified hearing loss type, unspecified laterality  Need for influenza vaccination  Need for Tdap vaccination  Hives  Acute stress reaction  Other orders -     Tdap vaccine greater than or equal to 7yo IM -     Flu Vaccine QUAD 6+ mos PF IM (Fluarix Quad PF)    Follow-up pending labs, yearly for physical

## 2019-01-14 ENCOUNTER — Other Ambulatory Visit: Payer: Self-pay | Admitting: Medical

## 2019-01-14 LAB — CBC WITH DIFFERENTIAL/PLATELET
Basophils Absolute: 0.1 10*3/uL (ref 0.0–0.2)
Basos: 1 %
EOS (ABSOLUTE): 0.3 10*3/uL (ref 0.0–0.4)
Eos: 3 %
Hematocrit: 39.8 % (ref 34.0–46.6)
Hemoglobin: 13 g/dL (ref 11.1–15.9)
Immature Grans (Abs): 0 10*3/uL (ref 0.0–0.1)
Immature Granulocytes: 0 %
Lymphocytes Absolute: 2.7 10*3/uL (ref 0.7–3.1)
Lymphs: 24 %
MCH: 29.8 pg (ref 26.6–33.0)
MCHC: 32.7 g/dL (ref 31.5–35.7)
MCV: 91 fL (ref 79–97)
Monocytes Absolute: 0.7 10*3/uL (ref 0.1–0.9)
Monocytes: 6 %
Neutrophils Absolute: 7.5 10*3/uL — ABNORMAL HIGH (ref 1.4–7.0)
Neutrophils: 66 %
Platelets: 315 10*3/uL (ref 150–450)
RBC: 4.36 x10E6/uL (ref 3.77–5.28)
RDW: 12.6 % (ref 11.7–15.4)
WBC: 11.3 10*3/uL — ABNORMAL HIGH (ref 3.4–10.8)

## 2019-01-14 LAB — COMPREHENSIVE METABOLIC PANEL
ALT: 18 IU/L (ref 0–32)
AST: 19 IU/L (ref 0–40)
Albumin/Globulin Ratio: 1.5 (ref 1.2–2.2)
Albumin: 4.5 g/dL (ref 3.8–4.8)
Alkaline Phosphatase: 85 IU/L (ref 39–117)
BUN/Creatinine Ratio: 14 (ref 9–23)
BUN: 10 mg/dL (ref 6–24)
Bilirubin Total: 0.3 mg/dL (ref 0.0–1.2)
CO2: 21 mmol/L (ref 20–29)
Calcium: 9.4 mg/dL (ref 8.7–10.2)
Chloride: 102 mmol/L (ref 96–106)
Creatinine, Ser: 0.69 mg/dL (ref 0.57–1.00)
GFR calc Af Amer: 117 mL/min/{1.73_m2} (ref >59)
GFR calc non Af Amer: 102 mL/min/{1.73_m2} (ref >59)
Globulin, Total: 3.1 g/dL (ref 1.5–4.5)
Glucose: 99 mg/dL (ref 65–99)
Potassium: 4.7 mmol/L (ref 3.5–5.2)
Sodium: 138 mmol/L (ref 134–144)
Total Protein: 7.6 g/dL (ref 6.0–8.5)

## 2019-01-14 LAB — LIPID PANEL
Chol/HDL Ratio: 4.2 ratio (ref 0.0–4.4)
Cholesterol, Total: 222 mg/dL — ABNORMAL HIGH (ref 100–199)
HDL: 53 mg/dL (ref >39)
LDL Chol Calc (NIH): 152 mg/dL — ABNORMAL HIGH (ref 0–99)
Triglycerides: 95 mg/dL (ref 0–149)
VLDL Cholesterol Cal: 17 mg/dL (ref 5–40)

## 2019-01-14 LAB — VITAMIN D 25 HYDROXY (VIT D DEFICIENCY, FRACTURES): Vit D, 25-Hydroxy: 27.7 ng/mL — ABNORMAL LOW (ref 30.0–100.0)

## 2019-01-14 LAB — TSH: TSH: 3.96 u[IU]/mL (ref 0.450–4.500)

## 2019-01-14 MED ORDER — VITAMIN D 25 MCG (1000 UNIT) PO TABS: 1000.0000 [IU] | ORAL_TABLET | Freq: Every day | ORAL | 3 refills | Status: DC

## 2019-01-30 ENCOUNTER — Telehealth: Payer: Self-pay | Admitting: Medical

## 2019-01-30 NOTE — Telephone Encounter (Signed)
Requested records from [facility] received.

## 2019-02-04 ENCOUNTER — Encounter: Payer: Self-pay | Admitting: Medical

## 2019-02-12 ENCOUNTER — Ambulatory Visit (INDEPENDENT_AMBULATORY_CARE_PROVIDER_SITE_OTHER): Payer: 59 | Admitting: Medical

## 2019-02-12 ENCOUNTER — Other Ambulatory Visit: Payer: Self-pay

## 2019-02-12 ENCOUNTER — Encounter: Payer: Self-pay | Admitting: Medical

## 2019-02-12 VITALS — BP 110/78 | HR 68 | Temp 97.8°F | Ht 65.0 in | Wt 207.0 lb

## 2019-02-12 DIAGNOSIS — Z566 Other physical and mental strain related to work: Secondary | ICD-10-CM | POA: Insufficient documentation

## 2019-02-12 DIAGNOSIS — L509 Urticaria, unspecified: Secondary | ICD-10-CM | POA: Diagnosis not present

## 2019-02-12 MED ORDER — HYDROXYZINE HCL 10 MG PO TABS: 10.0000 mg | ORAL_TABLET | Freq: Two times a day (BID) | ORAL | 3 refills | Status: DC

## 2019-02-12 NOTE — Progress Notes (Signed)
Subjective: Chief Complaint  Patient presents with  . Urticaria    were on hands last night-gone    Here for hives.  She notes a history of hives.  She was seen here back in 2019 for hives.  However she has had problems with this on a regular basis in recent months.  Yesterday started having itching on the left hand, and then shortly thereafter hives popped up.  She used steroid cream that she had at home and oral Benadryl.  This helped calm down the itching and redness.  She notes frequent episodes like this in recent weeks.  It does seem to be more prominent at work.  She denies any specific trigger other than possibly stress.  She notes from a recent discussion at her physical that she is working a job of 3 people right now since they let some people go.  She does not take Benadryl or anything like that regularly except what flareup.  At home she has a dog but no other animals or pets.  She does not think any specific item she is around is causing this although she does note one time her husband used some carpet cleaning material and she broke out had the toe.  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  She is a Librarian, academic for procurement specialist.  No fever, no recent weight changes, no night sweats.  She has an appointment coming up for her Pap smear mammogram with gynecology.  She has her consult for first colonoscopy on November 4.  No swollen lymph nodes.  no other aggravating or relieving factors. No other complaint.   Past Medical History:  Diagnosis Date  . Hearing loss 2020  . Obese   . 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 Tinnitus 2020   Current Outpatient Medications on File Prior to Visit  Medication Sig Dispense Refill  . cholecalciferol (VITAMIN D3) 25 MCG (1000 UT) tablet Take 1 tablet (1,000 Units total) by mouth daily. 90 tablet 3  . ibuprofen (ADVIL,MOTRIN) 100 MG tablet Take 100 mg by mouth every 6 (six) hours as needed for fever.    . Multiple Vitamin (MULTIVITAMIN)  capsule Take 1 capsule by mouth daily.    Marland Kitchen triamcinolone cream (KENALOG) 0.1 % Apply 1 application topically 2 (two) times daily. 30 g 0   No current facility-administered medications on file prior to visit.    ROS as in subjective   Objective: BP 110/78   Pulse 68   Temp 97.8 F (36.6 C)   Ht 5\' 5"  (1.651 m)   Wt 207 lb (93.9 kg)   SpO2 97%   BMI 34.45 kg/m    Gen: wd, wn, nad No abnormality of skin today, no lymphadenopathy, no obvious hives particularly on the hands where she had involvement yesterday   Assessment: Encounter Diagnoses  Name Primary?  . Hives Yes  . Stress at work      Plan: We discussed her symptoms.  We discussed the possibility that stress could be triggering some of this.  She also is around cardboard boxes handling those throughout the day.  This could be a possible allergen trigger.  She will use the hydroxyzine below which has helped in the past.  I advised symptom diary.  We discussed the numerous possible things that can trigger hives.  She is in the process of updating her cancer screens in the next month including Pap smear, mammogram through gynecology as well as colonoscopy.  We discussed other testing often  is done including HIV test, labs, allergy testing.  She declines HIV test.  We just recently did labs with her physical and her CBC was slightly elevated for the white counts.  We will plan to repeat this in the next month depending on her call back.  We discussed the referral to allergist which would include some labs and allergy testing but she wants to try more conservative approach first.    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  Call report 1 to 2-week   Xoey was seen today for urticaria.  Diagnoses and all orders for this visit:  Hives  Stress at work  Other orders -     hydrOXYzine  (ATARAX/VISTARIL) 10 MG tablet; Take 1 tablet (10 mg total) by mouth 2 (two) times daily.

## 2019-02-14 ENCOUNTER — Encounter: Payer: Self-pay | Admitting: Medical

## 2019-04-15 HISTORY — PX: COLONOSCOPY: SHX174

## 2019-04-23 LAB — HM COLONOSCOPY

## 2019-05-30 ENCOUNTER — Encounter: Payer: Self-pay | Admitting: Medical

## 2019-09-15 DIAGNOSIS — Z6830 Body mass index (BMI) 30.0-30.9, adult: Secondary | ICD-10-CM | POA: Diagnosis not present

## 2019-09-15 DIAGNOSIS — Z130 Encounter for screening for diseases of the blood and blood-forming organs and certain disorders involving the immune mechanism: Secondary | ICD-10-CM | POA: Diagnosis not present

## 2019-09-15 DIAGNOSIS — Z124 Encounter for screening for malignant neoplasm of cervix: Secondary | ICD-10-CM | POA: Diagnosis not present

## 2019-09-15 DIAGNOSIS — Z01419 Encounter for gynecological examination (general) (routine) without abnormal findings: Secondary | ICD-10-CM | POA: Diagnosis not present

## 2019-09-15 DIAGNOSIS — Z1151 Encounter for screening for human papillomavirus (HPV): Secondary | ICD-10-CM | POA: Diagnosis not present

## 2019-09-15 LAB — RESULTS CONSOLE HPV: CHL HPV: NEGATIVE

## 2019-09-15 LAB — HM PAP SMEAR: HM Pap smear: ABNORMAL

## 2019-10-16 DIAGNOSIS — Z1231 Encounter for screening mammogram for malignant neoplasm of breast: Secondary | ICD-10-CM | POA: Diagnosis not present

## 2019-10-24 ENCOUNTER — Other Ambulatory Visit: Payer: Self-pay | Admitting: Obstetrics and Gynecology

## 2019-10-24 DIAGNOSIS — R928 Other abnormal and inconclusive findings on diagnostic imaging of breast: Secondary | ICD-10-CM

## 2019-11-04 ENCOUNTER — Other Ambulatory Visit: Payer: Self-pay

## 2019-11-04 ENCOUNTER — Ambulatory Visit: Payer: Self-pay

## 2019-11-04 ENCOUNTER — Ambulatory Visit
Admission: RE | Admit: 2019-11-04 | Discharge: 2019-11-04 | Disposition: A | Discharge status: Home / Self Care | Payer: BC Managed Care – PPO | Source: Ambulatory Visit | Attending: Obstetrics and Gynecology | Admitting: Obstetrics and Gynecology

## 2019-11-04 DIAGNOSIS — R928 Other abnormal and inconclusive findings on diagnostic imaging of breast: Secondary | ICD-10-CM

## 2019-11-04 DIAGNOSIS — R922 Inconclusive mammogram: Secondary | ICD-10-CM | POA: Diagnosis not present

## 2020-05-23 DIAGNOSIS — Z1152 Encounter for screening for COVID-19: Secondary | ICD-10-CM | POA: Diagnosis not present

## 2020-12-31 ENCOUNTER — Encounter: Payer: Self-pay | Admitting: Internal Medicine

## 2021-02-09 ENCOUNTER — Encounter: Payer: Self-pay | Admitting: Internal Medicine

## 2021-02-09 DIAGNOSIS — Z6839 Body mass index (BMI) 39.0-39.9, adult: Secondary | ICD-10-CM | POA: Diagnosis not present

## 2021-02-09 DIAGNOSIS — Z1231 Encounter for screening mammogram for malignant neoplasm of breast: Secondary | ICD-10-CM | POA: Diagnosis not present

## 2021-02-09 DIAGNOSIS — Z01419 Encounter for gynecological examination (general) (routine) without abnormal findings: Secondary | ICD-10-CM | POA: Diagnosis not present

## 2021-02-09 DIAGNOSIS — Z1389 Encounter for screening for other disorder: Secondary | ICD-10-CM | POA: Diagnosis not present

## 2021-02-09 DIAGNOSIS — Z130 Encounter for screening for diseases of the blood and blood-forming organs and certain disorders involving the immune mechanism: Secondary | ICD-10-CM | POA: Diagnosis not present

## 2021-02-09 LAB — HM MAMMOGRAPHY

## 2021-02-24 ENCOUNTER — Other Ambulatory Visit: Payer: Self-pay | Admitting: Obstetrics and Gynecology

## 2021-02-24 DIAGNOSIS — R928 Other abnormal and inconclusive findings on diagnostic imaging of breast: Secondary | ICD-10-CM

## 2021-03-12 ENCOUNTER — Other Ambulatory Visit: Payer: Self-pay | Admitting: Obstetrics and Gynecology

## 2021-03-12 ENCOUNTER — Ambulatory Visit
Admission: RE | Admit: 2021-03-12 | Discharge: 2021-03-12 | Disposition: A | Discharge status: Home / Self Care | Payer: BC Managed Care – PPO | Source: Ambulatory Visit | Attending: Obstetrics and Gynecology | Admitting: Obstetrics and Gynecology

## 2021-03-12 DIAGNOSIS — N6310 Unspecified lump in the right breast, unspecified quadrant: Secondary | ICD-10-CM

## 2021-03-12 DIAGNOSIS — R928 Other abnormal and inconclusive findings on diagnostic imaging of breast: Secondary | ICD-10-CM

## 2021-03-12 DIAGNOSIS — N6489 Other specified disorders of breast: Secondary | ICD-10-CM | POA: Diagnosis not present

## 2021-03-12 DIAGNOSIS — R922 Inconclusive mammogram: Secondary | ICD-10-CM | POA: Diagnosis not present

## 2021-03-14 ENCOUNTER — Other Ambulatory Visit: Payer: BC Managed Care – PPO

## 2021-03-16 ENCOUNTER — Ambulatory Visit
Admission: RE | Admit: 2021-03-16 | Discharge: 2021-03-16 | Disposition: A | Discharge status: Home / Self Care | Payer: BC Managed Care – PPO | Source: Ambulatory Visit | Attending: Obstetrics and Gynecology | Admitting: Obstetrics and Gynecology

## 2021-03-16 ENCOUNTER — Other Ambulatory Visit: Payer: Self-pay

## 2021-03-16 DIAGNOSIS — N6310 Unspecified lump in the right breast, unspecified quadrant: Secondary | ICD-10-CM

## 2021-03-16 DIAGNOSIS — N6315 Unspecified lump in the right breast, overlapping quadrants: Secondary | ICD-10-CM | POA: Diagnosis not present

## 2021-03-16 LAB — HM MAMMOGRAPHY

## 2021-05-13 ENCOUNTER — Other Ambulatory Visit: Payer: Self-pay | Admitting: Obstetrics and Gynecology

## 2021-05-13 DIAGNOSIS — Z9889 Other specified postprocedural states: Secondary | ICD-10-CM

## 2021-05-15 HISTORY — PX: BREAST BIOPSY: SHX20

## 2021-06-17 ENCOUNTER — Other Ambulatory Visit: Payer: Self-pay

## 2021-06-17 ENCOUNTER — Ambulatory Visit (INDEPENDENT_AMBULATORY_CARE_PROVIDER_SITE_OTHER): Payer: BC Managed Care – PPO | Admitting: Medical

## 2021-06-17 VITALS — BP 110/70 | HR 96 | Wt 206.6 lb

## 2021-06-17 DIAGNOSIS — Z566 Other physical and mental strain related to work: Secondary | ICD-10-CM | POA: Diagnosis not present

## 2021-06-17 DIAGNOSIS — G4700 Insomnia, unspecified: Secondary | ICD-10-CM

## 2021-06-17 DIAGNOSIS — L509 Urticaria, unspecified: Secondary | ICD-10-CM

## 2021-06-17 DIAGNOSIS — F419 Anxiety disorder, unspecified: Secondary | ICD-10-CM | POA: Diagnosis not present

## 2021-06-17 MED ORDER — BUPROPION HCL ER (XL) 150 MG PO TB24: 150.0000 mg | ORAL_TABLET | Freq: Every day | ORAL | 2 refills | Status: DC

## 2021-06-17 MED ORDER — VITAMIN D 25 MCG (1000 UNIT) PO TABS: 1000.0000 [IU] | ORAL_TABLET | Freq: Every day | ORAL | 3 refills | Status: AC

## 2021-06-17 MED ORDER — HYDROXYZINE HCL 10 MG PO TABS: 10.0000 mg | ORAL_TABLET | Freq: Three times a day (TID) | ORAL | 2 refills | Status: DC | PRN

## 2021-06-17 NOTE — Progress Notes (Signed)
Subjective:  Cathy Bartlett is a 56 y.o. female who presents for Chief Complaint  Patient presents with   hives when stress    When stressed she will break out in hives. This morning was a break out but not broke out now     Here for complaint of stress and hives.  I saw her in 2020 for similar.  She only gets rash hives and itching when under stress.  Lately there has been a lot of stress at work.  She is a Merchandiser, retail.  She has a hard time sometimes separating work from her personal time.  She gets texts sometimes at night from the second shift staff about questions.  She is being asked to do 3 times alert she was doing prior since she has had employees leave and also they had a computer change at work recently so they are having to do things and redundant ways.    She is having a hard time sleeping.  If she starts thinking about work when she is at home she will get hives.  She does not have any concerns about an allergen.  Otherwise has been in normal state of health.  She has crying spells at times feeling so stressed.  She is having to work long hours.  She works at [employer], works in Sports administrator  No other aggravating or relieving factors.    No other c/o.   The following portions of the patient's history were reviewed and updated as appropriate: allergies, current medications, past family history, past medical history, past social history, past surgical history and problem list.  ROS Otherwise as in subjective above   Objective: BP 110/70    Pulse 96    Wt 206 lb 9.6 oz (93.7 kg)    BMI 34.38 kg/m   General appearance: alert, no distress, well developed, well nourished No obvious rash or hives today Psych: Pleasant, answers questions appropriately but seemed a little anxious    Assessment: Encounter Diagnoses  Name Primary?   Hives Yes   Work stress    Insomnia, unspecified type    Anxiety      Plan: We discussed his concerns.  We had a visit in 2020 for  similar.  This seems to be mostly stress related.  She also stress eats.  Begin back on hydroxyzine as needed, begin back on vitamin D and begin new medicine Wellbutrin to help with mood and anxiety.  We discussed setting boundaries with work.  We discussed having good communication with supervisors and human resources to help her with her efforts on hiring staff and to help her not get so overloaded.   We discussed the role of exercise, stress reduction, relaxation.  I also recommended she come back soon for a fasting physical as her last visit was 2020.  Consider counseling as we discussed  Cathy Bartlett was seen today for hives when stress.  Diagnoses and all orders for this visit:  Hives  Work stress  Insomnia, unspecified type  Anxiety  Other orders -     hydrOXYzine (ATARAX) 10 MG tablet; Take 1 tablet (10 mg total) by mouth 3 (three) times daily as needed. -     cholecalciferol (VITAMIN D3) 25 MCG (1000 UNIT) tablet; Take 1 tablet (1,000 Units total) by mouth daily. -     buPROPion (WELLBUTRIN XL) 150 MG 24 hr tablet; Take 1 tablet (150 mg total) by mouth daily.    Follow up: 15mo

## 2021-07-18 ENCOUNTER — Encounter: Payer: Self-pay | Admitting: Medical

## 2021-07-18 ENCOUNTER — Ambulatory Visit (INDEPENDENT_AMBULATORY_CARE_PROVIDER_SITE_OTHER): Payer: BC Managed Care – PPO | Admitting: Medical

## 2021-07-18 VITALS — BP 110/64 | HR 76 | Ht 66.0 in | Wt 205.2 lb

## 2021-07-18 DIAGNOSIS — Z131 Encounter for screening for diabetes mellitus: Secondary | ICD-10-CM | POA: Diagnosis not present

## 2021-07-18 DIAGNOSIS — Z6833 Body mass index (BMI) 33.0-33.9, adult: Secondary | ICD-10-CM | POA: Diagnosis not present

## 2021-07-18 DIAGNOSIS — Z1329 Encounter for screening for other suspected endocrine disorder: Secondary | ICD-10-CM | POA: Insufficient documentation

## 2021-07-18 DIAGNOSIS — Z0000 Encounter for general adult medical examination without abnormal findings: Secondary | ICD-10-CM | POA: Diagnosis not present

## 2021-07-18 DIAGNOSIS — Z7185 Encounter for immunization safety counseling: Secondary | ICD-10-CM

## 2021-07-18 DIAGNOSIS — Z13220 Encounter for screening for lipoid disorders: Secondary | ICD-10-CM | POA: Diagnosis not present

## 2021-07-18 LAB — POCT URINALYSIS DIP (PROADVANTAGE DEVICE)
Bilirubin, UA: NEGATIVE
Glucose, UA: NEGATIVE mg/dL
Ketones, POC UA: NEGATIVE mg/dL
Leukocytes, UA: NEGATIVE
Nitrite, UA: NEGATIVE
Protein Ur, POC: NEGATIVE mg/dL
Specific Gravity, Urine: 1.02
Urobilinogen, Ur: NEGATIVE
pH, UA: 6 (ref 5.0–8.0)

## 2021-07-18 NOTE — Progress Notes (Signed)
Subjective:   HPI  Cathy Bartlett is a 56 y.o. female who presents for Chief Complaint  Patient presents with   fasting cpe    Fasting cpe no concerns. Sees obgyn- Huel Cote    Patient Care Team: Jac Canavan, PA-C as PCP - General (Family Medicine) Sees dentist Sees eye doctor Dr. Herbert Moors, [facility] Dr. 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   Tinnitus 2020    Family History  Problem Relation Age of Onset   Hypertension Mother    Diabetes Mother    Dementia Mother        died of dementia   Heart disease Mother 48       MI shortly after husband died   Heart disease Father 82       multiple MIs, CABG   Hypertension Father    Stroke Father    Diabetes Father    Diabetes Sister    Supraventricular tachycardia Sister    Diabetes Sister    Cancer Maternal Aunt        breast   Breast cancer Maternal Aunt    Cancer Maternal Aunt        breast   Breast cancer Maternal Aunt    Cancer Maternal Grandfather    Breast cancer Cousin      Current Outpatient Medications:    buPROPion (WELLBUTRIN XL) 150 MG 24 hr tablet, Take 1 tablet (150 mg total) by mouth daily., Disp: 30 tablet, Rfl: 2   cholecalciferol (VITAMIN D3) 25 MCG (1000 UNIT) tablet, Take 1 tablet (1,000 Units total) by mouth daily., Disp: 90 tablet, Rfl: 3   hydrOXYzine (ATARAX) 10 MG tablet, Take 1 tablet (10 mg total) by mouth 3 (three) times daily as needed., Disp: 90 tablet, Rfl: 2   ibuprofen (ADVIL,MOTRIN) 100 MG tablet, Take 100 mg by mouth every 6 (six) hours as needed for fever., Disp: , Rfl:    Multiple Vitamin (MULTIVITAMIN) capsule, Take 1 capsule by mouth daily., Disp: , Rfl:   No Known Allergies    Reviewed their medical, surgical, family, social, medication, and allergy history and updated chart as appropriate.   Review of Systems Constitutional: -fever, -chills, -sweats,  -unexpected weight change, -decreased appetite, -fatigue Allergy: -sneezing, -itching, -congestion Dermatology: -changing moles, --rash, -lumps ENT: -runny nose, -ear pain, -sore throat, -hoarseness, -sinus pain, -teeth pain, - ringing in ears, -hearing loss, -nosebleeds Cardiology: -chest pain, -palpitations, -swelling, -difficulty breathing when lying flat, -waking up short of breath Respiratory: -cough, -shortness of breath, -difficulty breathing with exercise or exertion, -wheezing, -coughing up blood Gastroenterology: -abdominal pain, -nausea, -vomiting, -diarrhea, -constipation, -blood in stool, -changes in bowel movement, -difficulty swallowing or eating Hematology: -bleeding, -bruising  Musculoskeletal: -joint aches, -muscle aches, -joint swelling, -back pain, -neck pain, -cramping, -changes in gait Ophthalmology: denies vision changes, eye redness, itching, discharge Urology: -burning with urination, -difficulty urinating, -blood in urine, -urinary frequency, -urgency, -incontinence Neurology: -headache, -weakness, -tingling, -numbness, -memory loss, -falls, -dizziness Psychology: -depressed mood, -agitation, -sleep problems Breast/gyn: -breast tendnerss, -discharge, -lumps, -vaginal discharge,- irregular periods, -heavy periods   Depression screen [facility] 2/9 06/17/2021 01/13/2019  Decreased Interest 0 0  Down, Depressed, Hopeless 0 0  PHQ - 2 Score 0 0       Objective:  BP 110/64    Pulse 76    Ht 5\' 6"  (1.676 m)    Wt 205 lb  3.2 oz (93.1 kg)    BMI 33.12 kg/m   General appearance: alert, no distress, WD/WN, Caucasian female Skin: unremarkable HEENT: normocephalic, conjunctiva/corneas normal, sclerae anicteric, PERRLA, EOMi Neck: supple, no lymphadenopathy, no thyromegaly, no masses, normal ROM, no bruits Chest: non tender, normal shape and expansion Heart: RRR, normal S1, S2, no murmurs Lungs: CTA bilaterally, no wheezes, rhonchi, or rales Abdomen: +bs, soft, non tender, non  distended, no masses, no hepatomegaly, no splenomegaly, no bruits Back: non tender, normal ROM, no scoliosis Musculoskeletal: upper extremities non tender, no obvious deformity, normal ROM throughout, lower extremities non tender, no obvious deformity, normal ROM throughout Extremities: no edema, no cyanosis, no clubbing Pulses: 2+ symmetric, upper and lower extremities, normal cap refill Neurological: alert, oriented x 3, CN2-12 intact, strength normal upper extremities and lower extremities, sensation normal throughout, DTRs 2+ throughout, no cerebellar signs, gait normal Psychiatric: normal affect, behavior normal, pleasant  Breast/gyn/rectal - deferred to gynecology    Assessment and Plan :   Encounter Diagnoses  Name Primary?   Encounter for health maintenance examination in adult Yes   Screening for lipid disorders    Screening for thyroid disorder    BMI 33.0-33.9,adult    Vaccine counseling    Screening for diabetes mellitus      This visit was a preventative care visit, also known as wellness visit or routine physical.   Topics typically include healthy lifestyle, diet, exercise, preventative care, vaccinations, sick and well care, proper use of emergency dept and after hours care, as well as other concerns.     Recommendations: Continue to return yearly for your annual wellness and preventative care visits.  This gives [place] a chance to discuss healthy lifestyle, exercise, vaccinations, review your chart record, and perform screenings where appropriate.  I recommend you see your eye doctor yearly for routine vision care.  I recommend you see your dentist yearly for routine dental care including hygiene visits twice yearly.  See your gynecologist yearly for routine gynecological care.   Vaccination recommendations were reviewed Immunization History  Administered Date(s) Administered   Influenza Split 03/15/2021   Influenza,inj,Quad PF,6+ Mos 01/13/2019   PFIZER(Purple  Top)SARS-COV-2 Vaccination 08/02/2019, 08/23/2019, 01/15/2020, 05/02/2020   Tdap 01/13/2019    Shingles vaccine:  I recommend you have a shingles vaccine to help prevent shingles or herpes zoster outbreak.   Please call your insurer to inquire about coverage for the Shingrix vaccine given in 2 doses.   Some insurers cover this vaccine after age 37, some cover this after age 28.  If your insurer covers this, then call to schedule appointment to have this vaccine here.    Screening for cancer: Colon cancer screening: Reviewed colonoscopy report from 12/202, normal, repeat 10 years  Breast cancer screening: You should perform a self breast exam monthly.   We reviewed recommendations for regular mammograms and breast cancer screening.  Cervical cancer screening: We reviewed recommendations for pap smear screening.   Skin cancer screening: Check your skin regularly for new changes, growing lesions, or other lesions of concern Come in for evaluation if you have skin lesions of concern.  Lung cancer screening: If you have a greater than 20 pack year history of tobacco use, then you may qualify for lung cancer screening with a chest CT scan.   Please call your insurance company to inquire about coverage for this test.  We currently don't have screenings for other cancers besides breast, cervical, colon, and lung cancers.  If you have a strong  family history of cancer or have other cancer screening concerns, please let me know.    Bone health: Get at least 150 minutes of aerobic exercise weekly Get weight bearing exercise at least once weekly Bone density test:  A bone density test is an imaging test that uses a type of X-ray to measure the amount of calcium and other minerals in your bones. The test may be used to diagnose or screen you for a condition that causes weak or thin bones (osteoporosis), predict your risk for a broken bone (fracture), or determine how well your osteoporosis  treatment is working. The bone density test is recommended for females 65 and older, or females or males <65 if certain risk factors such as thyroid disease, long term use of steroids such as for asthma or rheumatological issues, vitamin D deficiency, estrogen deficiency, family history of osteoporosis, self or family history of fragility fracture in first degree relative.    Heart health: Get at least 150 minutes of aerobic exercise weekly Limit alcohol It is important to maintain a healthy blood pressure and healthy cholesterol numbers  Heart disease screening: Screening for heart disease includes screening for blood pressure, fasting lipids, glucose/diabetes screening, BMI height to weight ratio, reviewed of smoking status, physical activity, and diet.    Goals include blood pressure 120/80 or less, maintaining a healthy lipid/cholesterol profile, preventing diabetes or keeping diabetes numbers under good control, not smoking or using tobacco products, exercising most days per week or at least 150 minutes per week of exercise, and eating healthy variety of fruits and vegetables, healthy oils, and avoiding unhealthy food choices like fried food, fast food, high sugar and high cholesterol foods.    Other tests may possibly include EKG test, CT coronary calcium score, echocardiogram, exercise treadmill stress test.   Reviewed 2020 EKG    Medical care options: I recommend you continue to seek care here first for routine care.  We try really hard to have available appointments Monday through Friday daytime hours for sick visits, acute visits, and physicals.  Urgent care should be used for after hours and weekends for significant issues that cannot wait till the next day.  The emergency department should be used for significant potentially life-threatening emergencies.  The emergency department is expensive, can often have long wait times for less significant concerns, so try to utilize primary  care, urgent care, or telemedicine when possible to avoid unnecessary trips to the emergency department.  Virtual visits and telemedicine have been introduced since the pandemic started in 2020, and can be convenient ways to receive medical care.  We offer virtual appointments as well to assist you in a variety of options to seek medical care.   Advanced Directives: I recommend you consider completing a Health Care Power of Attorney and Living Will.   These documents respect your wishes and help alleviate burdens on your loved ones if you were to become terminally ill or be in a position to need those documents enforced.    You can complete Advanced Directives yourself, have them notarized, then have copies made for our office, for you and for anybody you feel should have them in safe keeping.  Or, you can have an attorney prepare these documents.   If you haven't updated your Last Will and Testament in a while, it may be worthwhile having an attorney prepare these documents together and save on some costs.       Separate significant issues discussed: BMI > 30: work  on efforts to lose weight through health diet and exercise    Cathy Bartlett was seen today for fasting cpe.  Diagnoses and all orders for this visit:  Encounter for health maintenance examination in adult -     Comprehensive metabolic panel -     CBC -     Lipid panel -     Hemoglobin A1c -     POCT Urinalysis DIP (Proadvantage Device) -     TSH  Screening for lipid disorders -     Lipid panel  Screening for thyroid disorder -     TSH  BMI 33.0-33.9,adult  Vaccine counseling  Screening for diabetes mellitus -     Hemoglobin A1c    Follow-up pending labs, yearly for physical

## 2021-07-18 NOTE — Patient Instructions (Signed)
?This visit was a preventative care visit, also known as wellness visit or routine physical.   Topics typically include healthy lifestyle, diet, exercise, preventative care, vaccinations, sick and well care, proper use of emergency dept and after hours care, as well as other concerns.   ? ? ?Recommendations: ?Continue to return yearly for your annual wellness and preventative care visits.  This gives [place] a chance to discuss healthy lifestyle, exercise, vaccinations, review your chart record, and perform screenings where appropriate. ? ?I recommend you see your eye doctor yearly for routine vision care. ? ?I recommend you see your dentist yearly for routine dental care including hygiene visits twice yearly. ? ?See your gynecologist yearly for routine gynecological care. ? ? ?Vaccination recommendations were reviewed ?Immunization History  ?Administered Date(s) Administered  ? Influenza Split 03/15/2021  ? Influenza,inj,Quad PF,6+ Mos 01/13/2019  ? PFIZER(Purple Top)SARS-COV-2 Vaccination 08/02/2019, 08/23/2019, 01/15/2020, 05/02/2020  ? Tdap 01/13/2019  ? ? ?Shingles vaccine:  I recommend you have a shingles vaccine to help prevent shingles or herpes zoster outbreak.   Please call your insurer to inquire about coverage for the Shingrix vaccine given in 2 doses.   Some insurers cover this vaccine after age 59, some cover this after age 4.  If your insurer covers this, then call to schedule appointment to have this vaccine here. ? ? ? ?Screening for cancer: ?Colon cancer screening: ?Reviewed colonoscopy report from 12/202, normal, repeat 10 years ? ?Breast cancer screening: ?You should perform a self breast exam monthly.   ?We reviewed recommendations for regular mammograms and breast cancer screening. ? ?Cervical cancer screening: ?We reviewed recommendations for pap smear screening. ?  ?Skin cancer screening: ?Check your skin regularly for new changes, growing lesions, or other lesions of concern ?Come in for  evaluation if you have skin lesions of concern. ? ?Lung cancer screening: ?If you have a greater than 20 pack year history of tobacco use, then you may qualify for lung cancer screening with a chest CT scan.   Please call your insurance company to inquire about coverage for this test. ? ?We currently don't have screenings for other cancers besides breast, cervical, colon, and lung cancers.  If you have a strong family history of cancer or have other cancer screening concerns, please let me know.  ? ? ?Bone health: ?Get at least 150 minutes of aerobic exercise weekly ?Get weight bearing exercise at least once weekly ?Bone density test:  ?A bone density test is an imaging test that uses a type of X-ray to measure the amount of calcium and other minerals in your bones. ?The test may be used to diagnose or screen you for a condition that causes weak or thin bones (osteoporosis), predict your risk for a broken bone (fracture), or determine how well your osteoporosis treatment is working. ?The bone density test is recommended for females 18 and older, or females or males [id number] if certain risk factors such as thyroid disease, long term use of steroids such as for asthma or rheumatological issues, vitamin D deficiency, estrogen deficiency, family history of osteoporosis, self or family history of fragility fracture in first degree relative. ? ? ? ?Heart health: ?Get at least 150 minutes of aerobic exercise weekly ?Limit alcohol ?It is important to maintain a healthy blood pressure and healthy cholesterol numbers ? ?Heart disease screening: ?Screening for heart disease includes screening for blood pressure, fasting lipids, glucose/diabetes screening, BMI height to weight ratio, reviewed of smoking status, physical activity, and diet.   ? ?  Goals include blood pressure 120/80 or less, maintaining a healthy lipid/cholesterol profile, preventing diabetes or keeping diabetes numbers under good control, not smoking or using tobacco  products, exercising most days per week or at least 150 minutes per week of exercise, and eating healthy variety of fruits and vegetables, healthy oils, and avoiding unhealthy food choices like fried food, fast food, high sugar and high cholesterol foods.   ? ?Other tests may possibly include EKG test, CT coronary calcium score, echocardiogram, exercise treadmill stress test.  ? ?Reviewed 2020 EKG ? ? ? ?Medical care options: ?I recommend you continue to seek care here first for routine care.  We try really hard to have available appointments Monday through Friday daytime hours for sick visits, acute visits, and physicals.  Urgent care should be used for after hours and weekends for significant issues that cannot wait till the next day.  The emergency department should be used for significant potentially life-threatening emergencies.  The emergency department is expensive, can often have long wait times for less significant concerns, so try to utilize primary care, urgent care, or telemedicine when possible to avoid unnecessary trips to the emergency department.  Virtual visits and telemedicine have been introduced since the pandemic started in 2020, and can be convenient ways to receive medical care.  We offer virtual appointments as well to assist you in a variety of options to seek medical care. ? ? ?Advanced Directives: ?I recommend you consider completing a [place] and Living Will.   These documents respect your wishes and help alleviate burdens on your loved ones if you were to become terminally ill or be in a position to need those documents enforced.   ? ?You can complete Advanced Directives yourself, have them notarized, then have copies made for our office, for you and for anybody you feel should have them in safe keeping. ? ?Or, you can have an attorney prepare these documents.   If you haven't updated your Last Will and Testament in a while, it may be worthwhile having an attorney  prepare these documents together and save on some costs.     ? ? ?Separate significant issues discussed: ?BMI > 30: work on efforts to lose weight through health diet and exercise ? ?

## 2021-07-19 LAB — COMPREHENSIVE METABOLIC PANEL
ALT: 19 IU/L (ref 0–32)
AST: 21 IU/L (ref 0–40)
Albumin/Globulin Ratio: 1.4 (ref 1.2–2.2)
Albumin: 4.4 g/dL (ref 3.8–4.9)
Alkaline Phosphatase: 88 IU/L (ref 44–121)
BUN/Creatinine Ratio: 15 (ref 9–23)
BUN: 11 mg/dL (ref 6–24)
Bilirubin Total: 0.3 mg/dL (ref 0.0–1.2)
CO2: 20 mmol/L (ref 20–29)
Calcium: 9.5 mg/dL (ref 8.7–10.2)
Chloride: 105 mmol/L (ref 96–106)
Creatinine, Ser: 0.72 mg/dL (ref 0.57–1.00)
Globulin, Total: 3.2 g/dL (ref 1.5–4.5)
Glucose: 99 mg/dL (ref 70–99)
Potassium: 4.6 mmol/L (ref 3.5–5.2)
Sodium: 140 mmol/L (ref 134–144)
Total Protein: 7.6 g/dL (ref 6.0–8.5)
eGFR: 100 mL/min/{1.73_m2} (ref >59)

## 2021-07-19 LAB — LIPID PANEL
Chol/HDL Ratio: 4.6 ratio — ABNORMAL HIGH (ref 0.0–4.4)
Cholesterol, Total: 218 mg/dL — ABNORMAL HIGH (ref 100–199)
HDL: 47 mg/dL (ref >39)
LDL Chol Calc (NIH): 157 mg/dL — ABNORMAL HIGH (ref 0–99)
Triglycerides: 80 mg/dL (ref 0–149)
VLDL Cholesterol Cal: 14 mg/dL (ref 5–40)

## 2021-07-19 LAB — CBC
Hematocrit: 40.4 % (ref 34.0–46.6)
Hemoglobin: 13.6 g/dL (ref 11.1–15.9)
MCH: 30.3 pg (ref 26.6–33.0)
MCHC: 33.7 g/dL (ref 31.5–35.7)
MCV: 90 fL (ref 79–97)
Platelets: 371 10*3/uL (ref 150–450)
RBC: 4.49 x10E6/uL (ref 3.77–5.28)
RDW: 12.4 % (ref 11.7–15.4)
WBC: 10.2 10*3/uL (ref 3.4–10.8)

## 2021-07-19 LAB — HEMOGLOBIN A1C
Est. average glucose Bld gHb Est-mCnc: 120 mg/dL
Hgb A1c MFr Bld: 5.8 % — ABNORMAL HIGH (ref 4.8–5.6)

## 2021-07-19 LAB — TSH: TSH: 3.38 u[IU]/mL (ref 0.450–4.500)

## 2021-07-21 ENCOUNTER — Encounter: Payer: Self-pay | Admitting: Internal Medicine

## 2021-09-14 ENCOUNTER — Other Ambulatory Visit: Payer: Self-pay | Admitting: Obstetrics and Gynecology

## 2021-09-14 ENCOUNTER — Ambulatory Visit
Admission: RE | Admit: 2021-09-14 | Discharge: 2021-09-14 | Disposition: A | Discharge status: Home / Self Care | Payer: BC Managed Care – PPO | Source: Ambulatory Visit | Attending: Obstetrics and Gynecology | Admitting: Obstetrics and Gynecology

## 2021-09-14 DIAGNOSIS — Z9889 Other specified postprocedural states: Secondary | ICD-10-CM

## 2021-09-14 DIAGNOSIS — N6489 Other specified disorders of breast: Secondary | ICD-10-CM | POA: Diagnosis not present

## 2021-09-14 DIAGNOSIS — R928 Other abnormal and inconclusive findings on diagnostic imaging of breast: Secondary | ICD-10-CM | POA: Diagnosis not present

## 2021-10-07 ENCOUNTER — Other Ambulatory Visit: Payer: Self-pay | Admitting: Medical

## 2022-01-18 ENCOUNTER — Encounter: Payer: Self-pay | Admitting: Internal Medicine

## 2022-02-21 ENCOUNTER — Encounter: Payer: Self-pay | Admitting: Internal Medicine

## 2022-03-08 ENCOUNTER — Encounter: Payer: Self-pay | Admitting: Internal Medicine

## 2022-03-08 DIAGNOSIS — Z01419 Encounter for gynecological examination (general) (routine) without abnormal findings: Secondary | ICD-10-CM | POA: Diagnosis not present

## 2022-03-08 DIAGNOSIS — Z1389 Encounter for screening for other disorder: Secondary | ICD-10-CM | POA: Diagnosis not present

## 2022-03-08 LAB — HM PAP SMEAR: HM Pap smear: NEGATIVE

## 2022-03-20 ENCOUNTER — Ambulatory Visit
Admission: RE | Admit: 2022-03-20 | Discharge: 2022-03-20 | Disposition: A | Discharge status: Home / Self Care | Payer: BC Managed Care – PPO | Source: Ambulatory Visit | Attending: Obstetrics and Gynecology | Admitting: Obstetrics and Gynecology

## 2022-03-20 DIAGNOSIS — Z9889 Other specified postprocedural states: Secondary | ICD-10-CM

## 2022-03-20 DIAGNOSIS — N6489 Other specified disorders of breast: Secondary | ICD-10-CM | POA: Diagnosis not present

## 2022-03-20 DIAGNOSIS — R92323 Mammographic fibroglandular density, bilateral breasts: Secondary | ICD-10-CM | POA: Diagnosis not present

## 2022-05-05 ENCOUNTER — Other Ambulatory Visit: Payer: Self-pay | Admitting: Medical

## 2022-05-05 NOTE — Telephone Encounter (Signed)
Refill request last apt 07/18/21 next apt 07/20/22.

## 2022-07-20 ENCOUNTER — Ambulatory Visit (INDEPENDENT_AMBULATORY_CARE_PROVIDER_SITE_OTHER): Payer: BC Managed Care – PPO | Admitting: Medical

## 2022-07-20 ENCOUNTER — Encounter: Payer: Self-pay | Admitting: Medical

## 2022-07-20 VITALS — BP 110/70 | HR 72 | Ht 65.0 in | Wt 167.4 lb

## 2022-07-20 DIAGNOSIS — Z13220 Encounter for screening for lipoid disorders: Secondary | ICD-10-CM | POA: Diagnosis not present

## 2022-07-20 DIAGNOSIS — Z0000 Encounter for general adult medical examination without abnormal findings: Secondary | ICD-10-CM | POA: Diagnosis not present

## 2022-07-20 DIAGNOSIS — Z136 Encounter for screening for cardiovascular disorders: Secondary | ICD-10-CM

## 2022-07-20 DIAGNOSIS — H8090 Unspecified otosclerosis, unspecified ear: Secondary | ICD-10-CM

## 2022-07-20 DIAGNOSIS — Z6827 Body mass index (BMI) 27.0-27.9, adult: Secondary | ICD-10-CM

## 2022-07-20 DIAGNOSIS — H9190 Unspecified hearing loss, unspecified ear: Secondary | ICD-10-CM

## 2022-07-20 DIAGNOSIS — Z131 Encounter for screening for diabetes mellitus: Secondary | ICD-10-CM | POA: Diagnosis not present

## 2022-07-20 DIAGNOSIS — Z1389 Encounter for screening for other disorder: Secondary | ICD-10-CM

## 2022-07-20 DIAGNOSIS — F419 Anxiety disorder, unspecified: Secondary | ICD-10-CM

## 2022-07-20 DIAGNOSIS — Z7185 Encounter for immunization safety counseling: Secondary | ICD-10-CM

## 2022-07-20 DIAGNOSIS — Z8249 Family history of ischemic heart disease and other diseases of the circulatory system: Secondary | ICD-10-CM

## 2022-07-20 LAB — POCT URINALYSIS DIP (PROADVANTAGE DEVICE)
Bilirubin, UA: NEGATIVE
Blood, UA: NEGATIVE
Glucose, UA: NEGATIVE mg/dL
Ketones, POC UA: NEGATIVE mg/dL
Leukocytes, UA: NEGATIVE
Nitrite, UA: NEGATIVE
Protein Ur, POC: NEGATIVE mg/dL
Specific Gravity, Urine: 1.01
Urobilinogen, Ur: NEGATIVE
pH, UA: 7 (ref 5.0–8.0)

## 2022-07-20 LAB — LIPID PANEL

## 2022-07-20 MED ORDER — FLUOXETINE HCL 20 MG PO TABS: 20.0000 mg | ORAL_TABLET | Freq: Every day | ORAL | 2 refills | Status: DC

## 2022-07-20 NOTE — Patient Instructions (Signed)
This visit was a preventative care visit, also known as wellness visit or routine physical.   Topics typically include healthy lifestyle, diet, exercise, preventative care, vaccinations, sick and well care, proper use of emergency dept and after hours care, as well as other concerns.     Recommendations: Continue to return yearly for your annual wellness and preventative care visits.  This gives [place] a chance to discuss healthy lifestyle, exercise, vaccinations, review your chart record, and perform screenings where appropriate.  I recommend you see your eye doctor yearly for routine vision care.  I recommend you see your dentist yearly for routine dental care including hygiene visits twice yearly.  See your gynecologist yearly for routine gynecological care.   Vaccination recommendations were reviewed Immunization History  Administered Date(s) Administered   Influenza Split 03/15/2021   Influenza,inj,Quad PF,6+ Mos 01/13/2019   PFIZER(Purple Top)SARS-COV-2 Vaccination 08/02/2019, 08/23/2019, 01/15/2020, 05/02/2020   Tdap 01/13/2019    Shingles vaccine:  I recommend you have a shingles vaccine to help prevent shingles or herpes zoster outbreak.   Please call your insurer to inquire about coverage for the Shingrix vaccine given in 2 doses.   Some insurers cover this vaccine after age 55, some cover this after age 51.  If your insurer covers this, then call to schedule appointment to have this vaccine here.    Screening for cancer: Colon cancer screening: Reviewed colonoscopy report from 04/2019, normal, repeat 10 years  Breast cancer screening: You should perform a self breast exam monthly.   We reviewed recommendations for regular mammograms and breast cancer screening.  Cervical cancer screening: We reviewed recommendations for pap smear screening.   Skin cancer screening: Check your skin regularly for new changes, growing lesions, or other lesions of concern Come in for  evaluation if you have skin lesions of concern.  Lung cancer screening: If you have a greater than 20 pack year history of tobacco use, then you may qualify for lung cancer screening with a chest CT scan.   Please call your insurance company to inquire about coverage for this test.  We currently don't have screenings for other cancers besides breast, cervical, colon, and lung cancers.  If you have a strong family history of cancer or have other cancer screening concerns, please let me know.    Bone health: Get at least 150 minutes of aerobic exercise weekly Get weight bearing exercise at least once weekly Bone density test:  A bone density test is an imaging test that uses a type of X-ray to measure the amount of calcium and other minerals in your bones. The test may be used to diagnose or screen you for a condition that causes weak or thin bones (osteoporosis), predict your risk for a broken bone (fracture), or determine how well your osteoporosis treatment is working. The bone density test is recommended for females 49 and older, or females or males [id number] if certain risk factors such as thyroid disease, long term use of steroids such as for asthma or rheumatological issues, vitamin D deficiency, estrogen deficiency, family history of osteoporosis, self or family history of fragility fracture in first degree relative.    Heart health: Get at least 150 minutes of aerobic exercise weekly Limit alcohol It is important to maintain a healthy blood pressure and healthy cholesterol numbers  Heart disease screening: Screening for heart disease includes screening for blood pressure, fasting lipids, glucose/diabetes screening, BMI height to weight ratio, reviewed of smoking status, physical activity, and diet.  Goals include blood pressure 120/80 or less, maintaining a healthy lipid/cholesterol profile, preventing diabetes or keeping diabetes numbers under good control, not smoking or using tobacco  products, exercising most days per week or at least 150 minutes per week of exercise, and eating healthy variety of fruits and vegetables, healthy oils, and avoiding unhealthy food choices like fried food, fast food, high sugar and high cholesterol foods.    Consider CT coronary heart test, $95 screen   Medical care options: I recommend you continue to seek care here first for routine care.  We try really hard to have available appointments Monday through Friday daytime hours for sick visits, acute visits, and physicals.  Urgent care should be used for after hours and weekends for significant issues that cannot wait till the next day.  The emergency department should be used for significant potentially life-threatening emergencies.  The emergency department is expensive, can often have long wait times for less significant concerns, so try to utilize primary care, urgent care, or telemedicine when possible to avoid unnecessary trips to the emergency department.  Virtual visits and telemedicine have been introduced since the pandemic started in 2020, and can be convenient ways to receive medical care.  We offer virtual appointments as well to assist you in a variety of options to seek medical care.   Advanced Directives: I recommend you consider completing a [place] and Living Will.   These documents respect your wishes and help alleviate burdens on your loved ones if you were to become terminally ill or be in a position to need those documents enforced.    You can complete Advanced Directives yourself, have them notarized, then have copies made for our office, for you and for anybody you feel should have them in safe keeping.  Or, you can have an attorney prepare these documents.   If you haven't updated your Last Will and Testament in a while, it may be worthwhile having an attorney prepare these documents together and save on some costs.       Separate significant issues  discussed:  Anxiety - doing fine on Wellbutrin, working to reduce work stress  Congratulated her on weight loss in the past year.   Work on getting more exercise though, weight bearing and cardio

## 2022-07-20 NOTE — Progress Notes (Signed)
Subjective:   HPI  Cathy Bartlett is a 57 y.o. female who presents for Chief Complaint  Patient presents with   fasting cpe    Fasting cpe, no concerns    Patient Care Team: Celvin Taney, Camelia Eng, PA-C as PCP - General (Family Medicine) Paula Compton, MD as Consulting Physician (Obstetrics and Gynecology) Sees dentist Sees eye doctor Dr. 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   Tinnitus 2020    Family History  Problem Relation Age of Onset   Hypertension Mother    Diabetes Mother    Dementia Mother        died of dementia   Heart disease Mother 64       MI shortly after husband died   Heart disease Father 11       multiple MIs, CABG   Hypertension Father    Stroke Father    Diabetes Father    Diabetes Sister    Supraventricular tachycardia Sister    Diabetes Sister    Cancer Maternal Aunt        breast   Breast cancer Maternal Aunt    Cancer Maternal Aunt        breast   Breast cancer Maternal Aunt    Cancer Maternal Grandfather    Breast cancer Cousin      Current Outpatient Medications:    cholecalciferol (VITAMIN D3) 25 MCG (1000 UNIT) tablet, Take 1 tablet (1,000 Units total) by mouth daily., Disp: 90 tablet, Rfl: 3   FLUoxetine (PROZAC) 20 MG tablet, Take 1 tablet (20 mg total) by mouth daily., Disp: 30 tablet, Rfl: 2   ibuprofen (ADVIL,MOTRIN) 100 MG tablet, Take 100 mg by mouth every 6 (six) hours as needed for fever., Disp: , Rfl:    Multiple Vitamin (MULTIVITAMIN) capsule, Take 1 capsule by mouth daily., Disp: , Rfl:   No Known Allergies    Reviewed their medical, surgical, family, social, medication, and allergy history and updated chart as appropriate.  Review of Systems  Constitutional:  Negative for chills, fever, malaise/fatigue and weight loss.  HENT:  Negative  for congestion, ear pain, hearing loss, sore throat and tinnitus.   Eyes:  Negative for blurred vision, pain and redness.  Respiratory:  Negative for cough, hemoptysis and shortness of breath.   Cardiovascular:  Negative for chest pain, palpitations, orthopnea, claudication and leg swelling.  Gastrointestinal:  Negative for abdominal pain, blood in stool, constipation, diarrhea, nausea and vomiting.  Genitourinary:  Negative for dysuria, flank pain, frequency, hematuria and urgency.  Musculoskeletal:  Negative for falls, joint pain and myalgias.  Skin:  Negative for itching and rash.  Neurological:  Negative for dizziness, tingling, speech change, weakness and headaches.  Endo/Heme/Allergies:  Negative for polydipsia. Does not bruise/bleed easily.  Psychiatric/Behavioral:  Negative for depression and memory loss. The patient is not nervous/anxious and does not have insomnia.         07/20/2022    8:41 AM 06/17/2021    1:27 PM 01/13/2019    9:24 AM  Depression screen PHQ 2/9  Decreased Interest 0 0 0  Down, Depressed, Hopeless 0 0 0  PHQ - 2 Score 0 0 0       Objective:  BP 110/70   Pulse 72   Ht '5\' 5"'$  (1.651 m)  Wt 167 lb 6.4 oz (75.9 kg)   BMI 27.86 kg/m   Wt Readings from Last 3 Encounters:  07/20/22 167 lb 6.4 oz (75.9 kg)  07/18/21 205 lb 3.2 oz (93.1 kg)  06/17/21 206 lb 9.6 oz (93.7 kg)    General appearance: alert, no distress, WD/WN, Caucasian female Skin: unremarkable HEENT: normocephalic, conjunctiva/corneas normal, sclerae anicteric, PERRLA, EOMi Neck: supple, no lymphadenopathy, no thyromegaly, no masses, normal ROM, no bruits Chest: non tender, normal shape and expansion Heart: RRR, normal S1, S2, no murmurs Lungs: CTA bilaterally, no wheezes, rhonchi, or rales Abdomen: +bs, soft, non tender, non distended, no masses, no hepatomegaly, no splenomegaly, no bruits Back: non tender, normal ROM, no scoliosis Musculoskeletal: upper extremities non tender, no  obvious deformity, normal ROM throughout, lower extremities non tender, no obvious deformity, normal ROM throughout Extremities: no edema, no cyanosis, no clubbing Pulses: 2+ symmetric, upper and lower extremities, normal cap refill Neurological: alert, oriented x 3, CN2-12 intact, strength normal upper extremities and lower extremities, sensation normal throughout, DTRs 2+ throughout, no cerebellar signs, gait normal Psychiatric: normal affect, behavior normal, pleasant  Breast/gyn/rectal - deferred to gynecology    Assessment and Plan :   Encounter Diagnoses  Name Primary?   Encounter for health maintenance examination in adult Yes   Hearing loss, unspecified hearing loss type, unspecified laterality    Otosclerosis, unspecified laterality    Anxiety    Vaccine counseling    Screening for lipid disorders    Family history of premature CAD    Screening for heart disease    BMI 27.0-27.9,adult    Screening for diabetes mellitus    Screening for hematuria or proteinuria       This visit was a preventative care visit, also known as wellness visit or routine physical.   Topics typically include healthy lifestyle, diet, exercise, preventative care, vaccinations, sick and well care, proper use of emergency dept and after hours care, as well as other concerns.     Recommendations: Continue to return yearly for your annual wellness and preventative care visits.  This gives [place] a chance to discuss healthy lifestyle, exercise, vaccinations, review your chart record, and perform screenings where appropriate.  I recommend you see your eye doctor yearly for routine vision care.  I recommend you see your dentist yearly for routine dental care including hygiene visits twice yearly.  See your gynecologist yearly for routine gynecological care.   Vaccination recommendations were reviewed Immunization History  Administered Date(s) Administered   COVID-19, mRNA, vaccine(Comirnaty)12 years and  older 02/11/2022   Influenza Split 03/15/2021   Influenza,inj,Quad PF,6+ Mos 01/13/2019   Influenza-Unspecified 01/14/2022   PFIZER(Purple Top)SARS-COV-2 Vaccination 08/02/2019, 08/23/2019, 01/15/2020, 05/02/2020   Tdap 01/13/2019    Shingles vaccine:  I recommend you have a shingles vaccine to help prevent shingles or herpes zoster outbreak.   Please call your insurer to inquire about coverage for the Shingrix vaccine given in 2 doses.   Some insurers cover this vaccine after age 62, some cover this after age 61.  If your insurer covers this, then call to schedule appointment to have this vaccine here.    Screening for cancer: Colon cancer screening: Reviewed colonoscopy report from 04/2019, normal, repeat 10 years  Breast cancer screening: You should perform a self breast exam monthly.   We reviewed recommendations for regular mammograms and breast cancer screening.  Cervical cancer screening: We reviewed recommendations for pap smear screening.   Skin cancer screening: Check your skin regularly for  new changes, growing lesions, or other lesions of concern Come in for evaluation if you have skin lesions of concern.  Lung cancer screening: If you have a greater than 20 pack year history of tobacco use, then you may qualify for lung cancer screening with a chest CT scan.   Please call your insurance company to inquire about coverage for this test.  We currently don't have screenings for other cancers besides breast, cervical, colon, and lung cancers.  If you have a strong family history of cancer or have other cancer screening concerns, please let me know.    Bone health: Get at least 150 minutes of aerobic exercise weekly Get weight bearing exercise at least once weekly Bone density test:  A bone density test is an imaging test that uses a type of X-ray to measure the amount of calcium and other minerals in your bones. The test may be used to diagnose or screen you for a  condition that causes weak or thin bones (osteoporosis), predict your risk for a broken bone (fracture), or determine how well your osteoporosis treatment is working. The bone density test is recommended for females 30 and older, or females or males [id number] if certain risk factors such as thyroid disease, long term use of steroids such as for asthma or rheumatological issues, vitamin D deficiency, estrogen deficiency, family history of osteoporosis, self or family history of fragility fracture in first degree relative.    Heart health: Get at least 150 minutes of aerobic exercise weekly Limit alcohol It is important to maintain a healthy blood pressure and healthy cholesterol numbers  Heart disease screening: Screening for heart disease includes screening for blood pressure, fasting lipids, glucose/diabetes screening, BMI height to weight ratio, reviewed of smoking status, physical activity, and diet.    Goals include blood pressure 120/80 or less, maintaining a healthy lipid/cholesterol profile, preventing diabetes or keeping diabetes numbers under good control, not smoking or using tobacco products, exercising most days per week or at least 150 minutes per week of exercise, and eating healthy variety of fruits and vegetables, healthy oils, and avoiding unhealthy food choices like fried food, fast food, high sugar and high cholesterol foods.    Consider CT coronary heart test, $95 screen   Medical care options: I recommend you continue to seek care here first for routine care.  We try really hard to have available appointments Monday through Friday daytime hours for sick visits, acute visits, and physicals.  Urgent care should be used for after hours and weekends for significant issues that cannot wait till the next day.  The emergency department should be used for significant potentially life-threatening emergencies.  The emergency department is expensive, can often have long wait times for less  significant concerns, so try to utilize primary care, urgent care, or telemedicine when possible to avoid unnecessary trips to the emergency department.  Virtual visits and telemedicine have been introduced since the pandemic started in 2020, and can be convenient ways to receive medical care.  We offer virtual appointments as well to assist you in a variety of options to seek medical care.   Advanced Directives: I recommend you consider completing a [place] and Living Will.   These documents respect your wishes and help alleviate burdens on your loved ones if you were to become terminally ill or be in a position to need those documents enforced.    You can complete Advanced Directives yourself, have them notarized, then have copies  made for our office, for you and for anybody you feel should have them in safe keeping.  Or, you can have an attorney prepare these documents.   If you haven't updated your Last Will and Testament in a while, it may be worthwhile having an attorney prepare these documents together and save on some costs.       Separate significant issues discussed:  Anxiety - not feeling like wellbutrin is working.  Change to Fluoxetine Prozac  Congratulated her on weight loss in the past year.   Work on getting more exercise though, weight bearing and cardio   Laketra was seen today for fasting cpe.  Diagnoses and all orders for this visit:  Encounter for health maintenance examination in adult -     Comprehensive metabolic panel -     CBC -     Lipid panel -     Hemoglobin A1c -     POCT Urinalysis DIP (Proadvantage Device)  Hearing loss, unspecified hearing loss type, unspecified laterality  Otosclerosis, unspecified laterality  Anxiety  Vaccine counseling  Screening for lipid disorders -     Lipid panel  Family history of premature CAD  Screening for heart disease  BMI 27.0-27.9,adult  Screening for diabetes mellitus -     Hemoglobin  A1c  Screening for hematuria or proteinuria  Other orders -     FLUoxetine (PROZAC) 20 MG tablet; Take 1 tablet (20 mg total) by mouth daily.    Follow-up pending labs, yearly for physical

## 2022-07-21 LAB — LIPID PANEL
Chol/HDL Ratio: 4 ratio (ref 0.0–4.4)
Cholesterol, Total: 230 mg/dL — ABNORMAL HIGH (ref 100–199)
HDL: 57 mg/dL (ref >39)
LDL Chol Calc (NIH): 162 mg/dL — ABNORMAL HIGH (ref 0–99)
Triglycerides: 66 mg/dL (ref 0–149)
VLDL Cholesterol Cal: 11 mg/dL (ref 5–40)

## 2022-07-21 LAB — COMPREHENSIVE METABOLIC PANEL
ALT: 32 IU/L (ref 0–32)
AST: 30 IU/L (ref 0–40)
Albumin/Globulin Ratio: 1.7 (ref 1.2–2.2)
Albumin: 4.7 g/dL (ref 3.8–4.9)
Alkaline Phosphatase: 80 IU/L (ref 44–121)
BUN/Creatinine Ratio: 15 (ref 9–23)
BUN: 11 mg/dL (ref 6–24)
Bilirubin Total: 0.4 mg/dL (ref 0.0–1.2)
CO2: 22 mmol/L (ref 20–29)
Calcium: 9.4 mg/dL (ref 8.7–10.2)
Chloride: 104 mmol/L (ref 96–106)
Creatinine, Ser: 0.74 mg/dL (ref 0.57–1.00)
Globulin, Total: 2.8 g/dL (ref 1.5–4.5)
Glucose: 89 mg/dL (ref 70–99)
Potassium: 4.6 mmol/L (ref 3.5–5.2)
Sodium: 141 mmol/L (ref 134–144)
Total Protein: 7.5 g/dL (ref 6.0–8.5)
eGFR: 96 mL/min/{1.73_m2} (ref >59)

## 2022-07-21 LAB — CBC
Hematocrit: 40.8 % (ref 34.0–46.6)
Hemoglobin: 13.7 g/dL (ref 11.1–15.9)
MCH: 30.7 pg (ref 26.6–33.0)
MCHC: 33.6 g/dL (ref 31.5–35.7)
MCV: 92 fL (ref 79–97)
Platelets: 323 10*3/uL (ref 150–450)
RBC: 4.46 x10E6/uL (ref 3.77–5.28)
RDW: 12.5 % (ref 11.7–15.4)
WBC: 8 10*3/uL (ref 3.4–10.8)

## 2022-07-21 LAB — HEMOGLOBIN A1C
Est. average glucose Bld gHb Est-mCnc: 108 mg/dL
Hgb A1c MFr Bld: 5.4 % (ref 4.8–5.6)

## 2022-07-21 NOTE — Progress Notes (Signed)
Results sent through MyChart

## 2022-10-23 ENCOUNTER — Telehealth: Payer: Self-pay | Admitting: Medical

## 2022-10-23 NOTE — Telephone Encounter (Signed)
Prescription Request  10/23/2022  LOV: 07/20/2022  What is the name of the medication or equipment? Fluoxetine Insurance now requires 90 days instead of 30 days   Have you contacted your pharmacy to request a refill? Yes   Which pharmacy would you like this sent to?  [facility] - [place], [place] - [address] [place] [address] Phone: [phone] Fax: [phone]    Patient notified that their request is being sent to the clinical staff for review and that they should receive a response within 2 business days.   Please advise at Mobile [phone] (mobile)

## 2022-10-24 ENCOUNTER — Other Ambulatory Visit: Payer: Self-pay | Admitting: Medical

## 2022-10-24 MED ORDER — FLUOXETINE HCL 20 MG PO TABS: 20.0000 mg | ORAL_TABLET | Freq: Every day | ORAL | 0 refills | Status: DC

## 2022-11-02 ENCOUNTER — Telehealth: Payer: Self-pay | Admitting: Medical

## 2022-11-02 MED ORDER — FLUOXETINE HCL 20 MG PO TABS: 20.0000 mg | ORAL_TABLET | Freq: Every day | ORAL | 0 refills | Status: DC

## 2022-11-02 NOTE — Telephone Encounter (Signed)
Recv'd fax from [facility] req refill Fluoxetine 20mg  #90

## 2022-11-02 NOTE — Telephone Encounter (Signed)
Pt called and jsut wanted to let you know that she only got 30 pills on 10/24/2022 her insurnace would not cover it so she paid out of pocket, if another rx could be [facility]

## 2022-11-11 NOTE — Telephone Encounter (Signed)
done

## 2023-01-13 ENCOUNTER — Other Ambulatory Visit: Payer: Self-pay | Admitting: Family Medicine

## 2023-02-02 ENCOUNTER — Other Ambulatory Visit: Payer: Self-pay | Admitting: Obstetrics and Gynecology

## 2023-02-02 DIAGNOSIS — Z1231 Encounter for screening mammogram for malignant neoplasm of breast: Secondary | ICD-10-CM

## 2023-03-14 DIAGNOSIS — Z01419 Encounter for gynecological examination (general) (routine) without abnormal findings: Secondary | ICD-10-CM | POA: Diagnosis not present

## 2023-03-14 DIAGNOSIS — R87610 Atypical squamous cells of undetermined significance on cytologic smear of cervix (ASC-US): Secondary | ICD-10-CM | POA: Diagnosis not present

## 2023-03-16 ENCOUNTER — Encounter: Payer: Self-pay | Admitting: Internal Medicine

## 2023-03-22 ENCOUNTER — Ambulatory Visit
Admission: RE | Admit: 2023-03-22 | Discharge: 2023-03-22 | Disposition: A | Discharge status: Home / Self Care | Payer: BC Managed Care – PPO | Source: Ambulatory Visit | Attending: Obstetrics and Gynecology | Admitting: Obstetrics and Gynecology

## 2023-03-22 DIAGNOSIS — Z1231 Encounter for screening mammogram for malignant neoplasm of breast: Secondary | ICD-10-CM

## 2023-03-27 ENCOUNTER — Other Ambulatory Visit: Payer: Self-pay | Admitting: Obstetrics and Gynecology

## 2023-03-27 DIAGNOSIS — R928 Other abnormal and inconclusive findings on diagnostic imaging of breast: Secondary | ICD-10-CM

## 2023-04-07 ENCOUNTER — Ambulatory Visit: Payer: BC Managed Care – PPO

## 2023-04-07 ENCOUNTER — Ambulatory Visit
Admission: RE | Admit: 2023-04-07 | Discharge: 2023-04-07 | Disposition: A | Discharge status: Home / Self Care | Payer: BC Managed Care – PPO | Source: Ambulatory Visit | Attending: Obstetrics and Gynecology | Admitting: Obstetrics and Gynecology

## 2023-04-07 DIAGNOSIS — R928 Other abnormal and inconclusive findings on diagnostic imaging of breast: Secondary | ICD-10-CM | POA: Diagnosis not present

## 2023-07-17 ENCOUNTER — Other Ambulatory Visit: Payer: Self-pay | Admitting: Medical

## 2023-07-17 NOTE — Telephone Encounter (Signed)
 Last apt 07/20/22 next apt 07/23/23.

## 2023-07-20 NOTE — Progress Notes (Signed)
 Subjective:   HPI  Cathy Bartlett is a 58 y.o. female who presents for Chief Complaint  Patient presents with   Annual Exam    Fasting cpe, no concerns    Patient Care Team: Tysinger, Kermit Balo, PA-C as PCP - General (Family Medicine) Huel Cote, MD as Consulting Physician (Obstetrics and Gynecology) Sees dentist Sees eye doctor Dr. Herbert Moors, [facility]   Concerns:  She continues on fluoxetine Prozac 20 mg daily.  Doing okay in this regard.  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   Tinnitus 2020    Family History  Problem Relation Age of Onset   Hypertension Mother    Diabetes Mother    Dementia Mother        died of dementia   Heart disease Mother 53       MI shortly after husband died   Heart disease Father 2       multiple MIs, CABG   Hypertension Father    Stroke Father    Diabetes Father    Diabetes Sister    Supraventricular tachycardia Sister    Diabetes Sister    Cancer Maternal Aunt        breast   Breast cancer Maternal Aunt    Cancer Maternal Aunt        breast   Breast cancer Maternal Aunt    Cancer Maternal Grandfather    Breast cancer Cousin      Current Outpatient Medications:    cholecalciferol (VITAMIN D3) 25 MCG (1000 UNIT) tablet, Take 1 tablet (1,000 Units total) by mouth daily., Disp: 90 tablet, Rfl: 3   FLUoxetine (PROZAC) 20 MG tablet, TAKE 1 TABLET DAILY, Disp: 90 tablet, Rfl: 0   ibuprofen (ADVIL,MOTRIN) 100 MG tablet, Take 100 mg by mouth every 6 (six) hours as needed for fever., Disp: , Rfl:    Multiple Vitamin (MULTIVITAMIN) capsule, Take 1 capsule by mouth daily., Disp: , Rfl:   No Known Allergies    Reviewed their medical, surgical, family, social, medication, and allergy history and updated chart as appropriate.  Review of Systems  Constitutional:  Negative for chills, fever, malaise/fatigue  and weight loss.  HENT:  Negative for congestion, ear pain, hearing loss, sore throat and tinnitus.   Eyes:  Negative for blurred vision, pain and redness.  Respiratory:  Negative for cough, hemoptysis and shortness of breath.   Cardiovascular:  Negative for chest pain, palpitations, orthopnea, claudication and leg swelling.  Gastrointestinal:  Negative for abdominal pain, blood in stool, constipation, diarrhea, nausea and vomiting.  Genitourinary:  Negative for dysuria, flank pain, frequency, hematuria and urgency.  Musculoskeletal:  Negative for falls, joint pain and myalgias.  Skin:  Negative for itching and rash.  Neurological:  Negative for dizziness, tingling, speech change, weakness and headaches.  Endo/Heme/Allergies:  Negative for polydipsia. Does not bruise/bleed easily.  Psychiatric/Behavioral:  Negative for depression and memory loss. The patient is not nervous/anxious and does not have insomnia.         07/23/2023    8:24 AM 07/20/2022    8:41 AM 06/17/2021    1:27 PM 01/13/2019    9:24 AM  Depression screen PHQ 2/9  Decreased Interest 0 0 0 0  Down, Depressed, Hopeless 0 0 0 0  PHQ - 2 Score 0 0 0 0  Objective:  BP 110/70   Pulse 70   Ht 5\' 6"  (1.676 m)   Wt 186 lb (84.4 kg)   BMI 30.02 kg/m   Wt Readings from Last 3 Encounters:  07/23/23 186 lb (84.4 kg)  07/20/22 167 lb 6.4 oz (75.9 kg)  07/18/21 205 lb 3.2 oz (93.1 kg)    General appearance: alert, no distress, WD/WN, Caucasian female Skin: unremarkable HEENT: normocephalic, conjunctiva/corneas normal, sclerae anicteric, PERRLA, EOMi Neck: supple, no lymphadenopathy, no thyromegaly, no masses, normal ROM, no bruits Chest: non tender, normal shape and expansion Heart: RRR, normal S1, S2, no murmurs Lungs: CTA bilaterally, no wheezes, rhonchi, or rales Abdomen: +bs, soft, non tender, non distended, no masses, no hepatomegaly, no splenomegaly, no bruits Back: non tender, normal ROM, no  scoliosis Musculoskeletal: upper extremities non tender, no obvious deformity, normal ROM throughout, lower extremities non tender, no obvious deformity, normal ROM throughout Extremities: no edema, no cyanosis, no clubbing Pulses: 2+ symmetric, upper and lower extremities, normal cap refill Neurological: alert, oriented x 3, CN2-12 intact, strength normal upper extremities and lower extremities, sensation normal throughout, DTRs 2+ throughout, no cerebellar signs, gait normal Psychiatric: normal affect, behavior normal, pleasant  Breast/gyn/rectal - deferred to gynecology    Assessment and Plan :   Encounter Diagnoses  Name Primary?   Encounter for health maintenance examination in adult Yes   Vaccine counseling    Screening for hematuria or proteinuria    Screening for heart disease    Hyperlipidemia, unspecified hyperlipidemia type    Otosclerosis, unspecified laterality    Anxiety    Family history of premature CAD    Screening for lipid disorders    Screening for diabetes mellitus    Vitamin D deficiency    Screening for colon cancer      This visit was a preventative care visit, also known as wellness visit or routine physical.   Topics typically include healthy lifestyle, diet, exercise, preventative care, vaccinations, sick and well care, proper use of emergency dept and after hours care, as well as other concerns.     Recommendations: Continue to return yearly for your annual wellness and preventative care visits.  This gives [place] a chance to discuss healthy lifestyle, exercise, vaccinations, review your chart record, and perform screenings where appropriate.  I recommend you see your eye doctor yearly for routine vision care.  I recommend you see your dentist yearly for routine dental care including hygiene visits twice yearly.  See your gynecologist yearly for routine gynecological care.   Vaccination recommendations were reviewed Immunization History  Administered  Date(s) Administered   Influenza Split 03/15/2021   Influenza,inj,Quad PF,6+ Mos 01/13/2019   Influenza-Unspecified 01/14/2022, 03/16/2023   PFIZER(Purple Top)SARS-COV-2 Vaccination 08/02/2019, 08/23/2019, 01/15/2020, 05/02/2020   Pfizer(Comirnaty)Fall Seasonal Vaccine 12 years and older 02/11/2022, 03/16/2023   Tdap 01/13/2019   Consider Shingrix and pneumococcal vaccine.  You can do this here or at your pharmacy   Screening for cancer: Colon cancer screening: Reviewed colonoscopy report from 04/2019, normal, repeat 10 years We are sending you home today with FIT testing.  Please return the stool test  Breast cancer screening: You should perform a self breast exam monthly.   Continue monthly self breast exams.  I reviewed your up-to-date mammogram from November 2024  Cervical cancer screening: You are up-to-date on Pap smear from 2023   Skin cancer screening: Check your skin regularly for new changes, growing lesions, or other lesions of concern Come in for evaluation if you have  skin lesions of concern.  Lung cancer screening: If you have a greater than 20 pack year history of tobacco use, then you may qualify for lung cancer screening with a chest CT scan.   Please call your insurance company to inquire about coverage for this test.  We currently don't have screenings for other cancers besides breast, cervical, colon, and lung cancers.  If you have a strong family history of cancer or have other cancer screening concerns, please let me know.    Bone health: Get at least 150 minutes of aerobic exercise weekly Get weight bearing exercise at least once weekly Bone density test:  A bone density test is an imaging test that uses a type of X-ray to measure the amount of calcium and other minerals in your bones. The test may be used to diagnose or screen you for a condition that causes weak or thin bones (osteoporosis), predict your risk for a broken bone (fracture), or determine  how well your osteoporosis treatment is working. The bone density test is recommended for females 65 and older, or females or males <65 if certain risk factors such as thyroid disease, long term use of steroids such as for asthma or rheumatological issues, vitamin D deficiency, estrogen deficiency, family history of osteoporosis, self or family history of fragility fracture in first degree relative.    Heart health: Get at least 150 minutes of aerobic exercise weekly Limit alcohol It is important to maintain a healthy blood pressure and healthy cholesterol numbers  Heart disease screening: Screening for heart disease includes screening for blood pressure, fasting lipids, glucose/diabetes screening, BMI height to weight ratio, reviewed of smoking status, physical activity, and diet.    Goals include blood pressure 120/80 or less, maintaining a healthy lipid/cholesterol profile, preventing diabetes or keeping diabetes numbers under good control, not smoking or using tobacco products, exercising most days per week or at least 150 minutes per week of exercise, and eating healthy variety of fruits and vegetables, healthy oils, and avoiding unhealthy food choices like fried food, fast food, high sugar and high cholesterol foods.    Expect a phone call about the CT coronary heart test.  This should be about $99 out-of-pocket   Medical care options: I recommend you continue to seek care here first for routine care.  We try really hard to have available appointments Monday through Friday daytime hours for sick visits, acute visits, and physicals.  Urgent care should be used for after hours and weekends for significant issues that cannot wait till the next day.  The emergency department should be used for significant potentially life-threatening emergencies.  The emergency department is expensive, can often have long wait times for less significant concerns, so try to utilize primary care, urgent care, or  telemedicine when possible to avoid unnecessary trips to the emergency department.  Virtual visits and telemedicine have been introduced since the pandemic started in 2020, and can be convenient ways to receive medical care.  We offer virtual appointments as well to assist you in a variety of options to seek medical care.   Advanced Directives: I recommend you consider completing a Health Care Power of Attorney and Living Will.   These documents respect your wishes and help alleviate burdens on your loved ones if you were to become terminally ill or be in a position to need those documents enforced.    You can complete Advanced Directives yourself, have them notarized, then have copies made for our office, for you  and for anybody you feel should have them in safe keeping.  Or, you can have an attorney prepare these documents.   If you haven't updated your Last Will and Testament in a while, it may be worthwhile having an attorney prepare these documents together and save on some costs.       Separate significant issues discussed:  Anxiety -doing okay on fluoxetine Prozac 20 mg daily  Vitamin D deficiency-continue vitamin D supplement, updated labs today  High cholesterol-I recommend cholesterol medicine to lower your LDL bad cholesterol and lower your risk of heart disease and stroke.  Consider medication such as Crestor daily at bedtime to lower your risk.  We are also setting you up for a CT coronary heart test  Recommendations for improving lipids:  Foods TO AVOID or limit - fried foods, high sugar foods, white bread, enriched flour, fast food, red meat, large amounts of cheese, processed foods such as little debbie cakes, cookies, pies, donuts, for example  Foods TO INCLUDE in the diet - whole grains such as whole grain pasta, whole grain bread, barley, steel cut oatmeal (not instant oatmeal), avocado, fish, green leafy vegetables, nuts, increased fiber in diet, and using olive oil in  small amounts for cooking or as salad dressing vinaigrette.      Sybol was seen today for annual exam.  Diagnoses and all orders for this visit:  Encounter for health maintenance examination in adult -     CBC with Differential/Platelet -     CMP14+EGFR -     Hemoglobin A1c -     Lipid panel -     TSH -     VITAMIN D 25 Hydroxy (Vit-D Deficiency, Fractures) -     POCT Urinalysis DIP (Proadvantage Device) -     CT CARDIAC SCORING (SELF PAY ONLY); Future  Vaccine counseling  Screening for hematuria or proteinuria -     POCT Urinalysis DIP (Proadvantage Device)  Screening for heart disease -     CT CARDIAC SCORING (SELF PAY ONLY); Future  Hyperlipidemia, unspecified hyperlipidemia type -     Lipid panel  Otosclerosis, unspecified laterality  Anxiety  Family history of premature CAD  Screening for lipid disorders  Screening for diabetes mellitus -     Hemoglobin A1c  Vitamin D deficiency -     VITAMIN D 25 Hydroxy (Vit-D Deficiency, Fractures)  Screening for colon cancer    Follow-up pending labs, yearly for physical

## 2023-07-23 ENCOUNTER — Encounter: Payer: Self-pay | Admitting: Medical

## 2023-07-23 ENCOUNTER — Ambulatory Visit: Payer: BC Managed Care – PPO | Admitting: Medical

## 2023-07-23 VITALS — BP 110/70 | HR 70 | Ht 66.0 in | Wt 186.0 lb

## 2023-07-23 DIAGNOSIS — Z0000 Encounter for general adult medical examination without abnormal findings: Secondary | ICD-10-CM | POA: Diagnosis not present

## 2023-07-23 DIAGNOSIS — Z7185 Encounter for immunization safety counseling: Secondary | ICD-10-CM

## 2023-07-23 DIAGNOSIS — F419 Anxiety disorder, unspecified: Secondary | ICD-10-CM | POA: Diagnosis not present

## 2023-07-23 DIAGNOSIS — Z1389 Encounter for screening for other disorder: Secondary | ICD-10-CM | POA: Diagnosis not present

## 2023-07-23 DIAGNOSIS — Z8249 Family history of ischemic heart disease and other diseases of the circulatory system: Secondary | ICD-10-CM

## 2023-07-23 DIAGNOSIS — Z13220 Encounter for screening for lipoid disorders: Secondary | ICD-10-CM

## 2023-07-23 DIAGNOSIS — Z136 Encounter for screening for cardiovascular disorders: Secondary | ICD-10-CM

## 2023-07-23 DIAGNOSIS — E785 Hyperlipidemia, unspecified: Secondary | ICD-10-CM

## 2023-07-23 DIAGNOSIS — H8090 Unspecified otosclerosis, unspecified ear: Secondary | ICD-10-CM | POA: Diagnosis not present

## 2023-07-23 DIAGNOSIS — Z131 Encounter for screening for diabetes mellitus: Secondary | ICD-10-CM

## 2023-07-23 DIAGNOSIS — Z1211 Encounter for screening for malignant neoplasm of colon: Secondary | ICD-10-CM

## 2023-07-23 DIAGNOSIS — E559 Vitamin D deficiency, unspecified: Secondary | ICD-10-CM

## 2023-07-23 LAB — POCT URINALYSIS DIP (PROADVANTAGE DEVICE)
Bilirubin, UA: NEGATIVE
Blood, UA: NEGATIVE
Glucose, UA: NEGATIVE mg/dL
Ketones, POC UA: NEGATIVE mg/dL
Leukocytes, UA: NEGATIVE
Nitrite, UA: NEGATIVE
Protein Ur, POC: NEGATIVE mg/dL
Specific Gravity, Urine: 1.01
Urobilinogen, Ur: NEGATIVE
pH, UA: 6 (ref 5.0–8.0)

## 2023-07-23 NOTE — Patient Instructions (Signed)
 This visit was a preventative care visit, also known as wellness visit or routine physical.   Topics typically include healthy lifestyle, diet, exercise, preventative care, vaccinations, sick and well care, proper use of emergency dept and after hours care, as well as other concerns.     Recommendations: Continue to return yearly for your annual wellness and preventative care visits.  This gives [place] a chance to discuss healthy lifestyle, exercise, vaccinations, review your chart record, and perform screenings where appropriate.  I recommend you see your eye doctor yearly for routine vision care.  I recommend you see your dentist yearly for routine dental care including hygiene visits twice yearly.  See your gynecologist yearly for routine gynecological care.   Vaccination recommendations were reviewed Immunization History  Administered Date(s) Administered   Influenza Split 03/15/2021   Influenza,inj,Quad PF,6+ Mos 01/13/2019   Influenza-Unspecified 01/14/2022, 03/16/2023   PFIZER(Purple Top)SARS-COV-2 Vaccination 08/02/2019, 08/23/2019, 01/15/2020, 05/02/2020   Pfizer(Comirnaty)Fall Seasonal Vaccine 12 years and older 02/11/2022, 03/16/2023   Tdap 01/13/2019   Consider Shingrix and pneumococcal vaccine.  You can do this here or at your pharmacy   Screening for cancer: Colon cancer screening: Reviewed colonoscopy report from 04/2019, normal, repeat 10 years We are sending you home today with FIT testing.  Please return the stool test  Breast cancer screening: You should perform a self breast exam monthly.   Continue monthly self breast exams.  I reviewed your up-to-date mammogram from November 2024  Cervical cancer screening: You are up-to-date on Pap smear from 2023   Skin cancer screening: Check your skin regularly for new changes, growing lesions, or other lesions of concern Come in for evaluation if you have skin lesions of concern.  Lung cancer screening: If you have  a greater than 20 pack year history of tobacco use, then you may qualify for lung cancer screening with a chest CT scan.   Please call your insurance company to inquire about coverage for this test.  We currently don't have screenings for other cancers besides breast, cervical, colon, and lung cancers.  If you have a strong family history of cancer or have other cancer screening concerns, please let me know.    Bone health: Get at least 150 minutes of aerobic exercise weekly Get weight bearing exercise at least once weekly Bone density test:  A bone density test is an imaging test that uses a type of X-ray to measure the amount of calcium and other minerals in your bones. The test may be used to diagnose or screen you for a condition that causes weak or thin bones (osteoporosis), predict your risk for a broken bone (fracture), or determine how well your osteoporosis treatment is working. The bone density test is recommended for females 65 and older, or females or males <65 if certain risk factors such as thyroid disease, long term use of steroids such as for asthma or rheumatological issues, vitamin D deficiency, estrogen deficiency, family history of osteoporosis, self or family history of fragility fracture in first degree relative.    Heart health: Get at least 150 minutes of aerobic exercise weekly Limit alcohol It is important to maintain a healthy blood pressure and healthy cholesterol numbers  Heart disease screening: Screening for heart disease includes screening for blood pressure, fasting lipids, glucose/diabetes screening, BMI height to weight ratio, reviewed of smoking status, physical activity, and diet.    Goals include blood pressure 120/80 or less, maintaining a healthy lipid/cholesterol profile, preventing diabetes or keeping diabetes numbers under  good control, not smoking or using tobacco products, exercising most days per week or at least 150 minutes per week of exercise,  and eating healthy variety of fruits and vegetables, healthy oils, and avoiding unhealthy food choices like fried food, fast food, high sugar and high cholesterol foods.    Expect a phone call about the CT coronary heart test.  This should be about $99 out-of-pocket   Medical care options: I recommend you continue to seek care here first for routine care.  We try really hard to have available appointments Monday through Friday daytime hours for sick visits, acute visits, and physicals.  Urgent care should be used for after hours and weekends for significant issues that cannot wait till the next day.  The emergency department should be used for significant potentially life-threatening emergencies.  The emergency department is expensive, can often have long wait times for less significant concerns, so try to utilize primary care, urgent care, or telemedicine when possible to avoid unnecessary trips to the emergency department.  Virtual visits and telemedicine have been introduced since the pandemic started in 2020, and can be convenient ways to receive medical care.  We offer virtual appointments as well to assist you in a variety of options to seek medical care.   Advanced Directives: I recommend you consider completing a Health Care Power of Attorney and Living Will.   These documents respect your wishes and help alleviate burdens on your loved ones if you were to become terminally ill or be in a position to need those documents enforced.    You can complete Advanced Directives yourself, have them notarized, then have copies made for our office, for you and for anybody you feel should have them in safe keeping.  Or, you can have an attorney prepare these documents.   If you haven't updated your Last Will and Testament in a while, it may be worthwhile having an attorney prepare these documents together and save on some costs.       Separate significant issues discussed:  Anxiety -doing okay on  fluoxetine Prozac 20 mg daily  Vitamin D deficiency-continue vitamin D supplement, updated labs today  High cholesterol-I recommend cholesterol medicine to lower your LDL bad cholesterol and lower your risk of heart disease and stroke.  Consider medication such as Crestor daily at bedtime to lower your risk.  We are also setting you up for a CT coronary heart test  Recommendations for improving lipids:  Foods TO AVOID or limit - fried foods, high sugar foods, white bread, enriched flour, fast food, red meat, large amounts of cheese, processed foods such as little debbie cakes, cookies, pies, donuts, for example  Foods TO INCLUDE in the diet - whole grains such as whole grain pasta, whole grain bread, barley, steel cut oatmeal (not instant oatmeal), avocado, fish, green leafy vegetables, nuts, increased fiber in diet, and using olive oil in small amounts for cooking or as salad dressing vinaigrette.

## 2023-07-24 LAB — CBC WITH DIFFERENTIAL/PLATELET
Basophils Absolute: 0 10*3/uL (ref 0.0–0.2)
Basos: 1 %
EOS (ABSOLUTE): 0.2 10*3/uL (ref 0.0–0.4)
Eos: 2 %
Hematocrit: 40.9 % (ref 34.0–46.6)
Hemoglobin: 13.3 g/dL (ref 11.1–15.9)
Immature Grans (Abs): 0 10*3/uL (ref 0.0–0.1)
Immature Granulocytes: 0 %
Lymphocytes Absolute: 2 10*3/uL (ref 0.7–3.1)
Lymphs: 25 %
MCH: 30 pg (ref 26.6–33.0)
MCHC: 32.5 g/dL (ref 31.5–35.7)
MCV: 92 fL (ref 79–97)
Monocytes Absolute: 0.5 10*3/uL (ref 0.1–0.9)
Monocytes: 6 %
Neutrophils Absolute: 5.2 10*3/uL (ref 1.4–7.0)
Neutrophils: 66 %
Platelets: 304 10*3/uL (ref 150–450)
RBC: 4.43 x10E6/uL (ref 3.77–5.28)
RDW: 12.4 % (ref 11.7–15.4)
WBC: 7.9 10*3/uL (ref 3.4–10.8)

## 2023-07-24 LAB — CMP14+EGFR
ALT: 17 IU/L (ref 0–32)
AST: 21 IU/L (ref 0–40)
Albumin: 4.4 g/dL (ref 3.8–4.9)
Alkaline Phosphatase: 87 IU/L (ref 44–121)
BUN/Creatinine Ratio: 15 (ref 9–23)
BUN: 11 mg/dL (ref 6–24)
Bilirubin Total: 0.3 mg/dL (ref 0.0–1.2)
CO2: 23 mmol/L (ref 20–29)
Calcium: 9.5 mg/dL (ref 8.7–10.2)
Chloride: 102 mmol/L (ref 96–106)
Creatinine, Ser: 0.71 mg/dL (ref 0.57–1.00)
Globulin, Total: 3 g/dL (ref 1.5–4.5)
Glucose: 94 mg/dL (ref 70–99)
Potassium: 4.5 mmol/L (ref 3.5–5.2)
Sodium: 139 mmol/L (ref 134–144)
Total Protein: 7.4 g/dL (ref 6.0–8.5)
eGFR: 100 mL/min/1.73

## 2023-07-24 LAB — TSH: TSH: 4.54 u[IU]/mL — ABNORMAL HIGH (ref 0.450–4.500)

## 2023-07-24 LAB — LIPID PANEL
Chol/HDL Ratio: 4.4 ratio (ref 0.0–4.4)
Cholesterol, Total: 238 mg/dL — ABNORMAL HIGH (ref 100–199)
HDL: 54 mg/dL (ref >39)
LDL Chol Calc (NIH): 160 mg/dL — ABNORMAL HIGH (ref 0–99)
Triglycerides: 132 mg/dL (ref 0–149)
VLDL Cholesterol Cal: 24 mg/dL (ref 5–40)

## 2023-07-24 LAB — HEMOGLOBIN A1C
Est. average glucose Bld gHb Est-mCnc: 117 mg/dL
Hgb A1c MFr Bld: 5.7 % — ABNORMAL HIGH (ref 4.8–5.6)

## 2023-07-24 LAB — VITAMIN D 25 HYDROXY (VIT D DEFICIENCY, FRACTURES): Vit D, 25-Hydroxy: 52.8 ng/mL (ref 30.0–100.0)

## 2023-07-24 NOTE — Progress Notes (Signed)
 Results sent through MyChart

## 2023-07-30 ENCOUNTER — Other Ambulatory Visit: Payer: Self-pay | Admitting: Medical

## 2023-07-30 ENCOUNTER — Ambulatory Visit (HOSPITAL_COMMUNITY)
Admission: RE | Admit: 2023-07-30 | Discharge: 2023-07-30 | Disposition: A | Discharge status: Home / Self Care | Payer: Self-pay | Source: Ambulatory Visit | Attending: Medical | Admitting: Medical

## 2023-07-30 DIAGNOSIS — Z136 Encounter for screening for cardiovascular disorders: Secondary | ICD-10-CM | POA: Insufficient documentation

## 2023-07-30 DIAGNOSIS — Z0000 Encounter for general adult medical examination without abnormal findings: Secondary | ICD-10-CM | POA: Insufficient documentation

## 2023-07-30 MED ORDER — ROSUVASTATIN CALCIUM 10 MG PO TABS: 10.0000 mg | ORAL_TABLET | ORAL | 2 refills | Status: DC

## 2023-07-30 NOTE — Progress Notes (Signed)
 Results sent through MyChart

## 2023-10-15 ENCOUNTER — Other Ambulatory Visit: Payer: Self-pay | Admitting: Medical

## 2023-10-31 LAB — LAB REPORT - SCANNED
A1c: 5.8
EGFR: 106
TSH: 2.84 (ref 0.41–5.90)

## 2024-01-27 ENCOUNTER — Other Ambulatory Visit: Payer: Self-pay | Admitting: Medical

## 2024-02-07 ENCOUNTER — Other Ambulatory Visit: Payer: Self-pay | Admitting: Obstetrics and Gynecology

## 2024-02-07 DIAGNOSIS — Z1231 Encounter for screening mammogram for malignant neoplasm of breast: Secondary | ICD-10-CM

## 2024-03-10 IMAGING — US US BREAST*R* LIMITED INC AXILLA
1 series · 2 of 2 positions shown · non-contrast
Comparison: Previous exam(s).

CLINICAL DATA: The patient had a mass in the right breast thought
to represent either a debris-filled duct or a papilloma. At biopsy,
a papilloma was not definitely identified. However, the pathology
report described papilloma and debris-filled duct is possibilities.
A six-month follow-up was recommended.

EXAM:
DIGITAL DIAGNOSTIC UNILATERAL RIGHT MAMMOGRAM WITH TOMOSYNTHESIS AND
CAD; ULTRASOUND RIGHT BREAST LIMITED
TECHNIQUE: Right digital diagnostic mammography and breast tomosynthesis was
performed. The images were evaluated with computer-aided detection.;
Targeted ultrasound examination of the right breast was performed

[Series 1: us breast*right* limited inc axilla · 0.06mm/px · 2 of 2 slices shown]
[im 1/2]
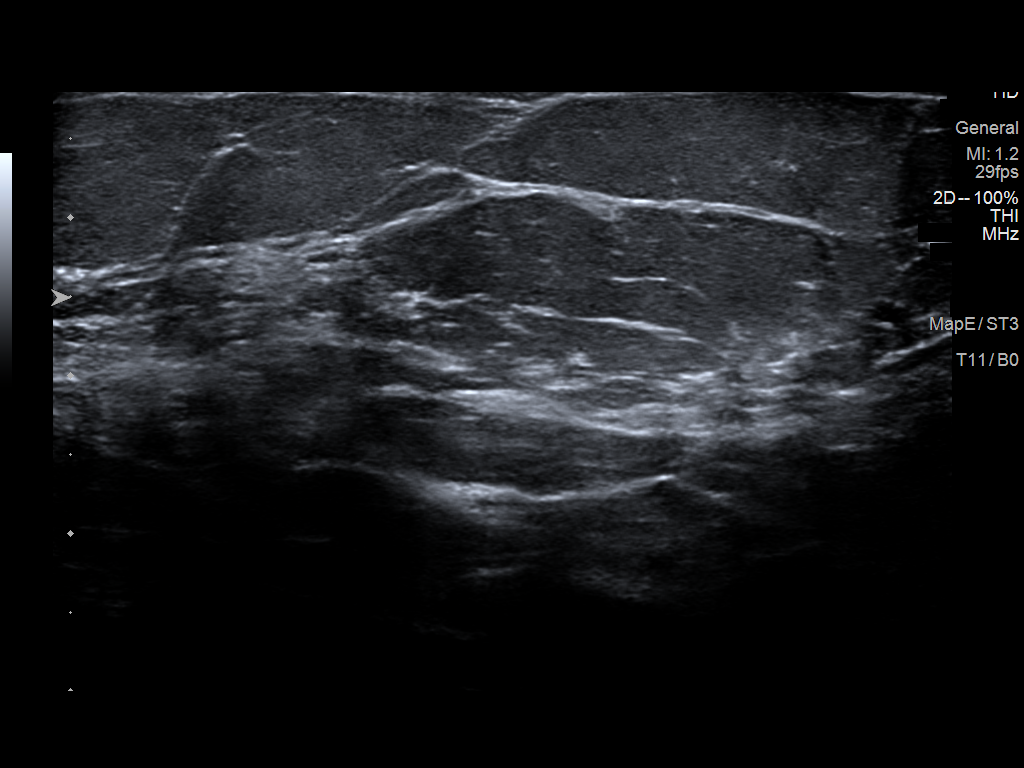
[im 2/2]
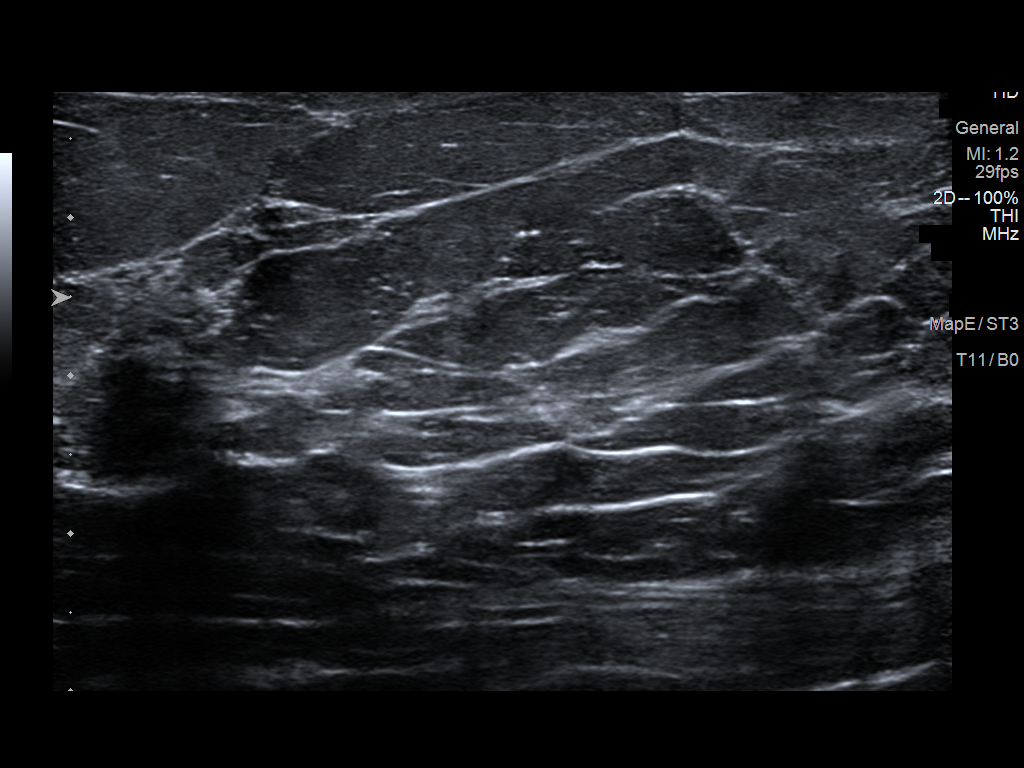

[2 of 2 positions shown; findings below may reference images not displayed]

ACR Breast Density Category b: There are scattered areas of
fibroglandular density.
FINDINGS: The biopsy clip is identified. There is no mass associated the
biopsy clip. No new or suspicious findings in the right breast.

Targeted ultrasound is performed, showing no remaining mass in the
region of the patient's biopsy.
IMPRESSION: Resolution of the previously biopsied mass. It is possible this is
due to the fact that the biopsied mass was a debris-filled duct.
However, the possibility that there was a papilloma which was
largely removed at biopsy cannot be excluded.

RECOMMENDATION:
Recommend six-month follow-up mammogram and ultrasound of the
previous benign biopsy site. The patient will be due for bilateral
mammography at that time.

I have discussed the findings and recommendations with the patient.
If applicable, a reminder letter will be sent to the patient
regarding the next appointment.

BI-RADS CATEGORY  2: Benign.

## 2024-03-10 IMAGING — MG MM DIGITAL DIAGNOSTIC UNILAT*R* W/ TOMO W/ CAD
4 series · 4 of 12 positions shown · non-contrast
Comparison: Previous exam(s).

CLINICAL DATA: The patient had a mass in the right breast thought
to represent either a debris-filled duct or a papilloma. At biopsy,
a papilloma was not definitely identified. However, the pathology
report described papilloma and debris-filled duct is possibilities.
A six-month follow-up was recommended.

EXAM:
DIGITAL DIAGNOSTIC UNILATERAL RIGHT MAMMOGRAM WITH TOMOSYNTHESIS AND
CAD; ULTRASOUND RIGHT BREAST LIMITED
TECHNIQUE: Right digital diagnostic mammography and breast tomosynthesis was
performed. The images were evaluated with computer-aided detection.;
Targeted ultrasound examination of the right breast was performed

[R CC synth-2D]
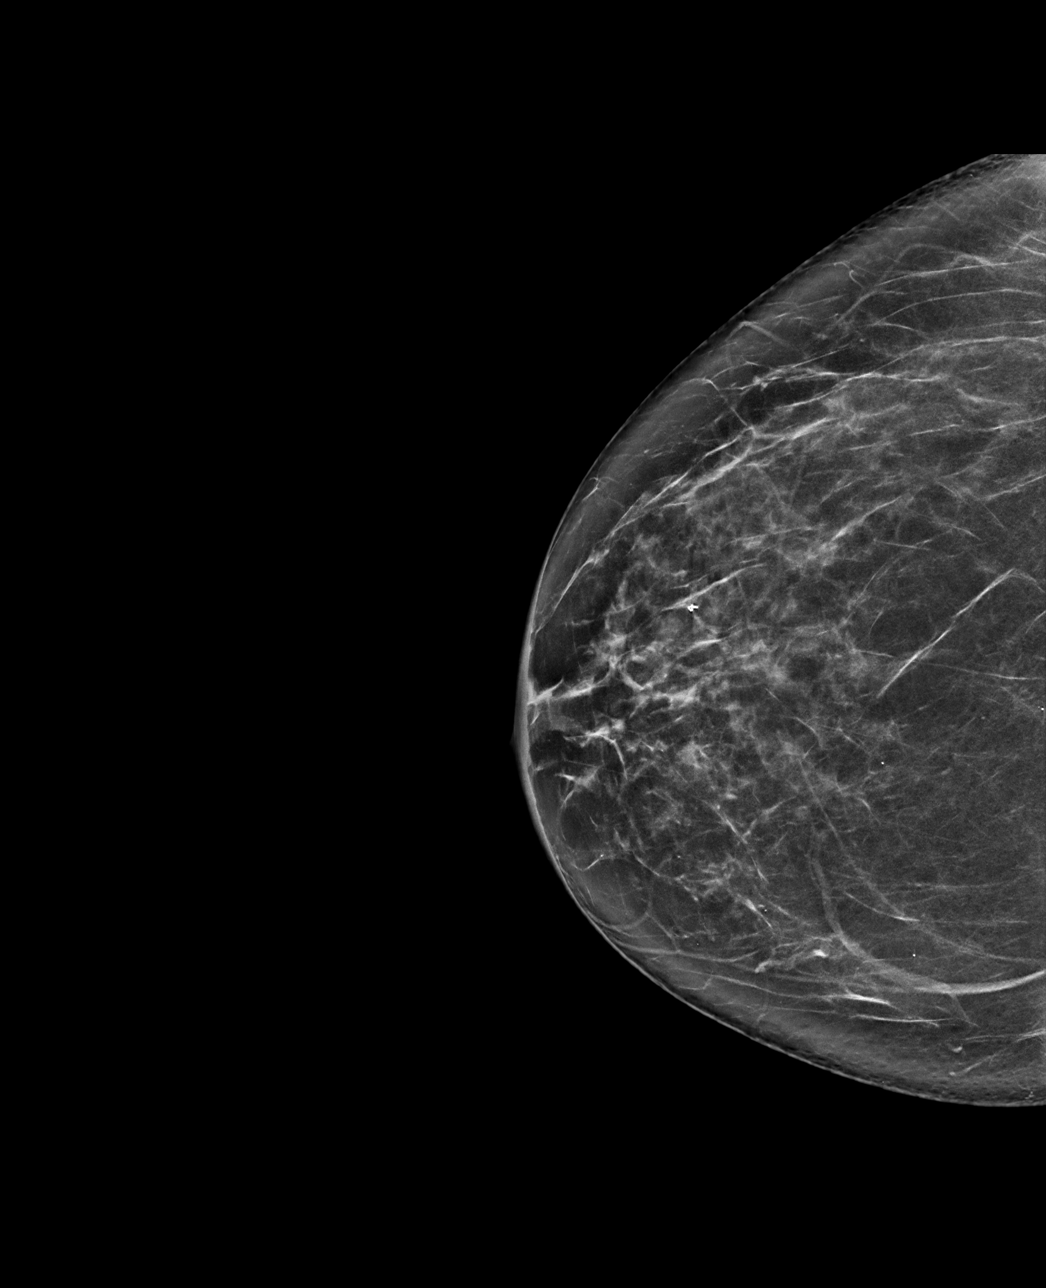

[R MLO synth-2D]
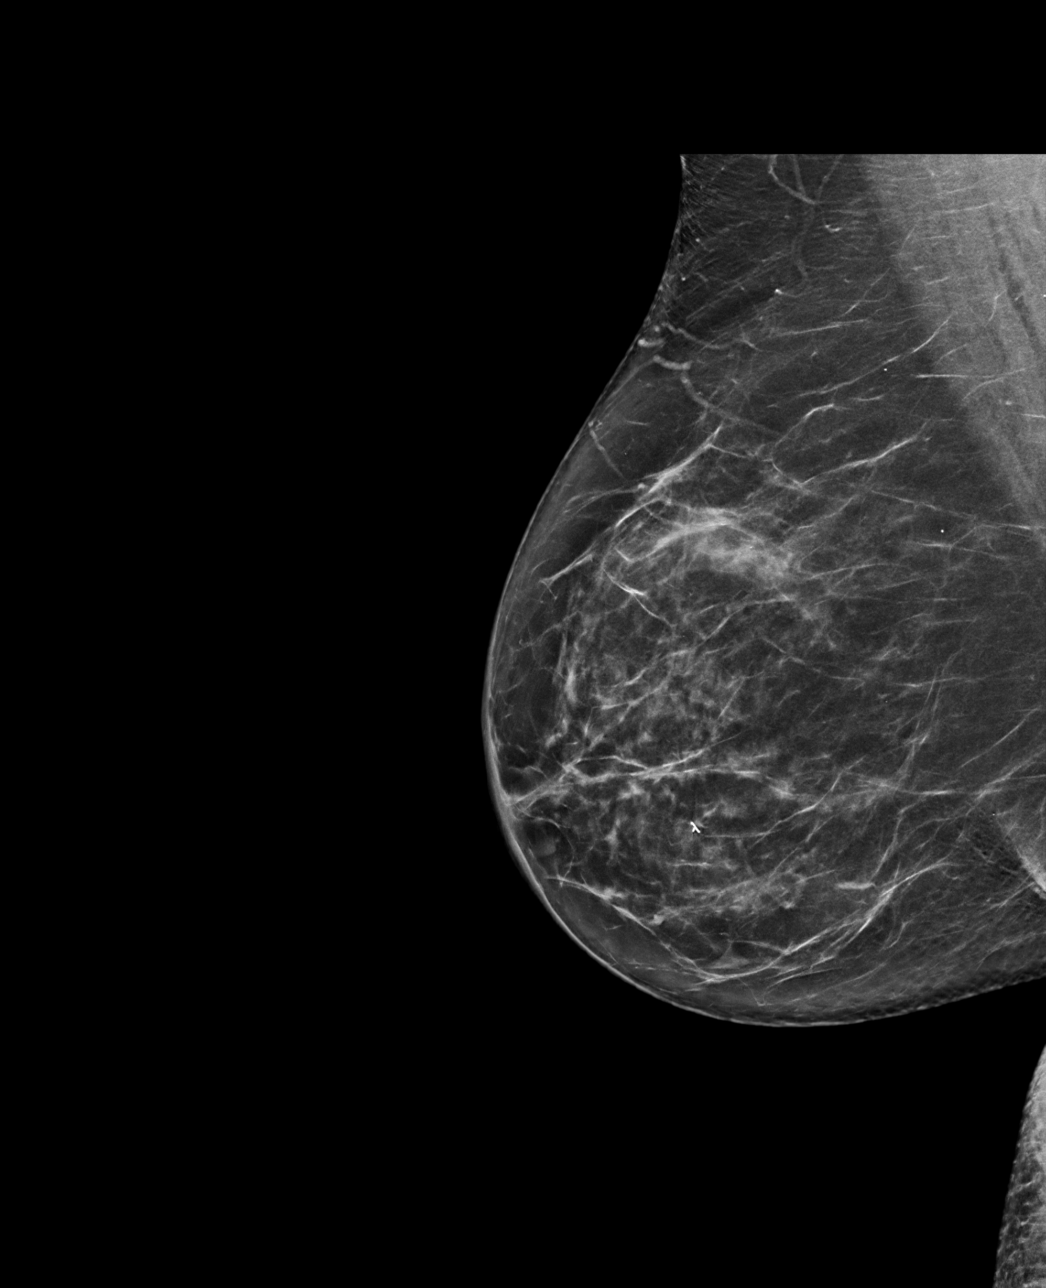

[R MLO tomo · tomo slice 41/80.0]
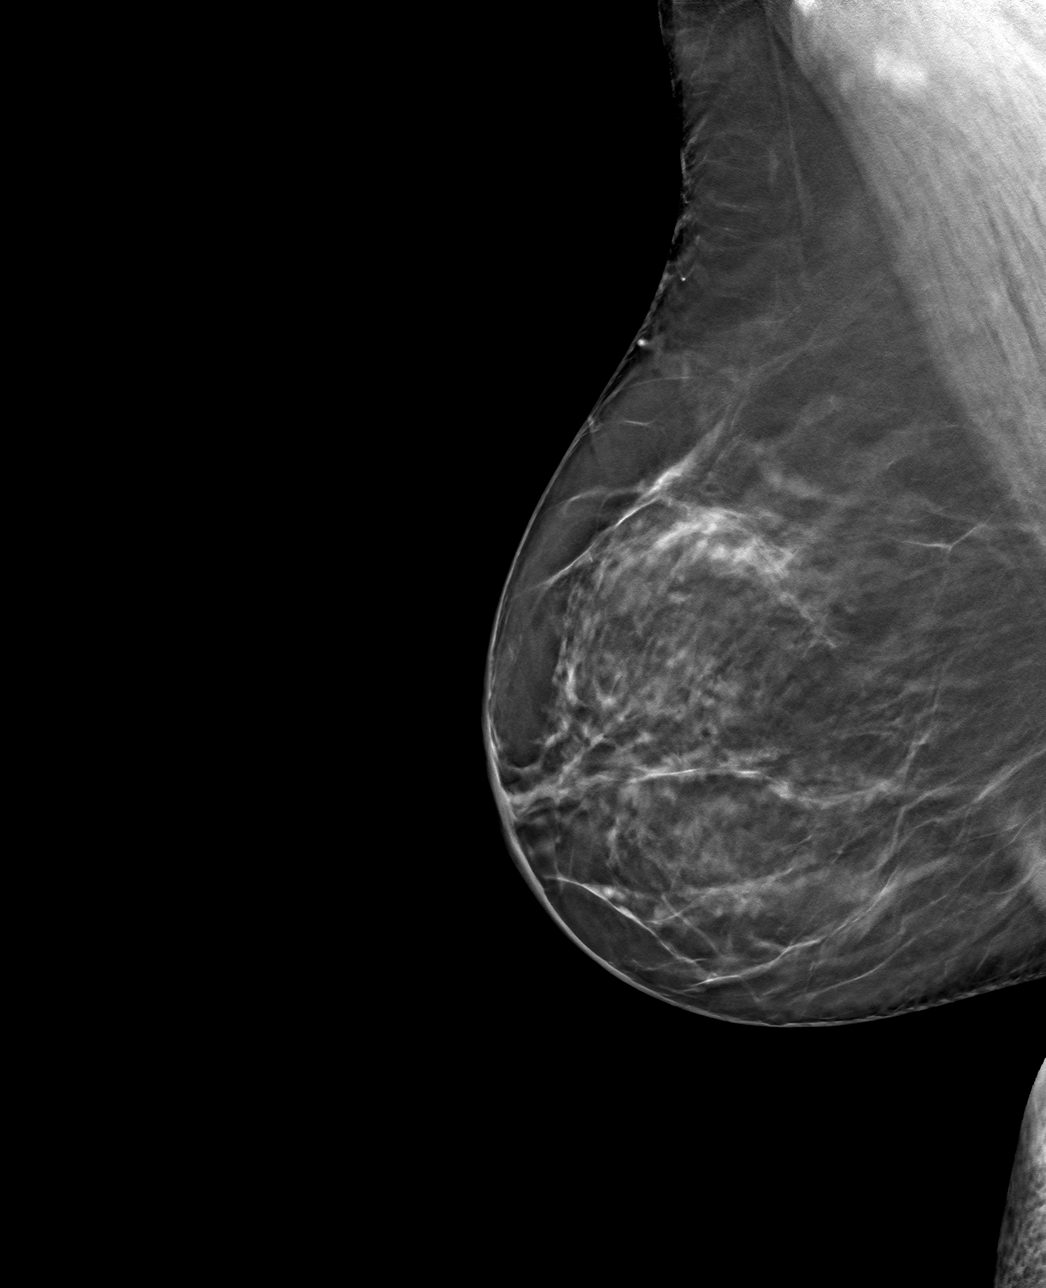

[R CC tomo · tomo slice 38/75.0]
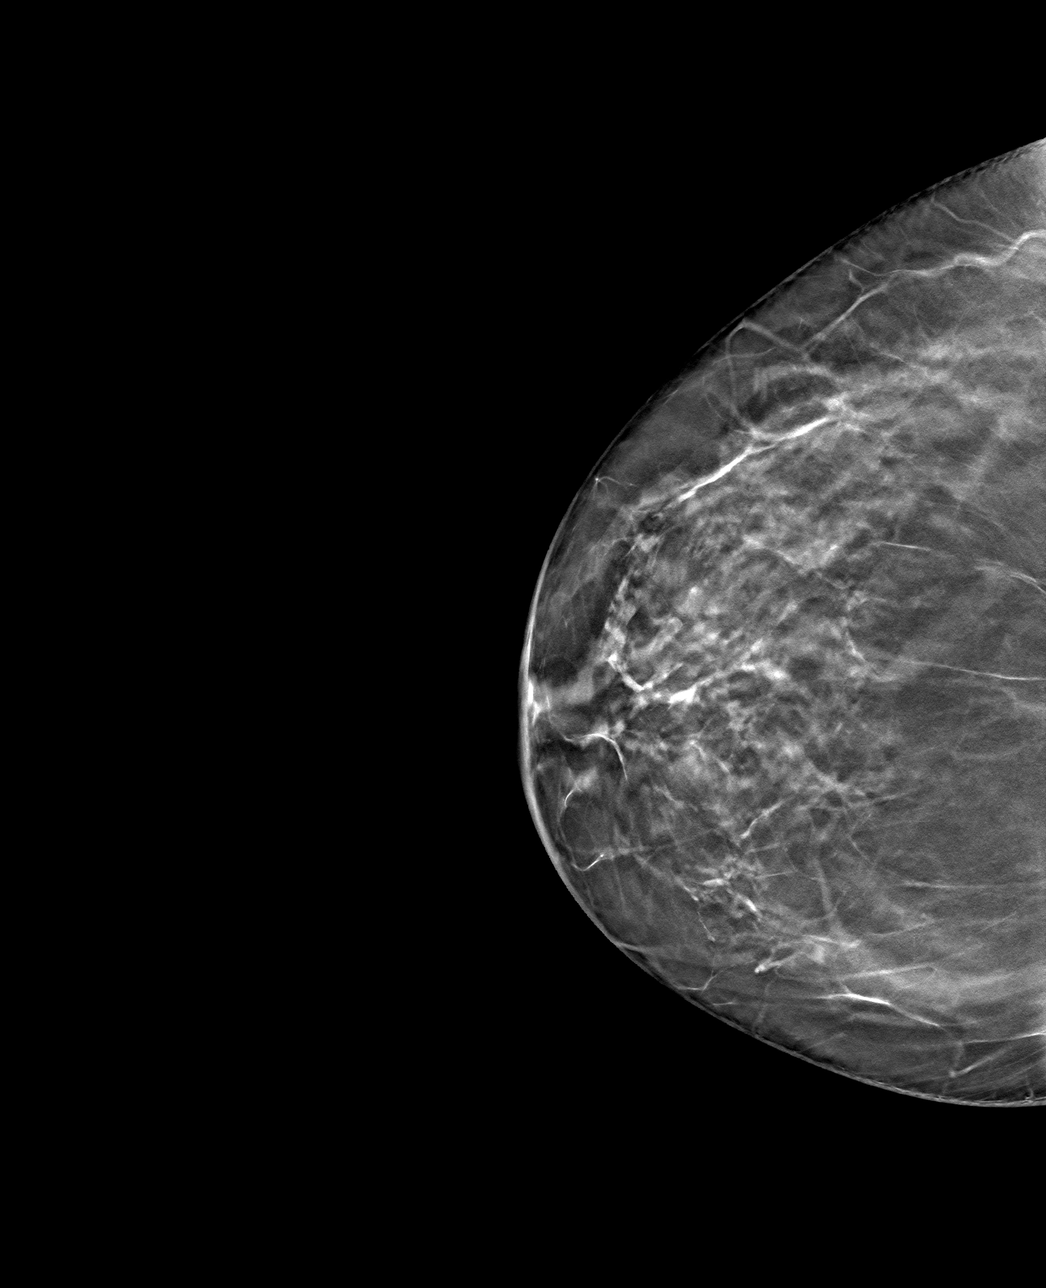

[4 of 12 positions shown; findings below may reference images not displayed]

ACR Breast Density Category b: There are scattered areas of
fibroglandular density.
FINDINGS: The biopsy clip is identified. There is no mass associated the
biopsy clip. No new or suspicious findings in the right breast.

Targeted ultrasound is performed, showing no remaining mass in the
region of the patient's biopsy.
IMPRESSION: Resolution of the previously biopsied mass. It is possible this is
due to the fact that the biopsied mass was a debris-filled duct.
However, the possibility that there was a papilloma which was
largely removed at biopsy cannot be excluded.

RECOMMENDATION:
Recommend six-month follow-up mammogram and ultrasound of the
previous benign biopsy site. The patient will be due for bilateral
mammography at that time.

I have discussed the findings and recommendations with the patient.
If applicable, a reminder letter will be sent to the patient
regarding the next appointment.

BI-RADS CATEGORY  2: Benign.

## 2024-03-24 ENCOUNTER — Ambulatory Visit
Admission: RE | Admit: 2024-03-24 | Discharge: 2024-03-24 | Disposition: A | Discharge status: Home / Self Care | Payer: Self-pay | Source: Ambulatory Visit | Attending: Obstetrics and Gynecology | Admitting: Obstetrics and Gynecology

## 2024-03-24 DIAGNOSIS — Z1231 Encounter for screening mammogram for malignant neoplasm of breast: Secondary | ICD-10-CM

## 2024-04-05 ENCOUNTER — Other Ambulatory Visit: Payer: Self-pay | Admitting: Medical

## 2024-06-05 ENCOUNTER — Ambulatory Visit
Admission: EM | Admit: 2024-06-05 | Discharge: 2024-06-05 | Disposition: A | Discharge status: Home / Self Care | Attending: Family Medicine | Admitting: Family Medicine

## 2024-06-05 DIAGNOSIS — T23162A Burn of first degree of back of left hand, initial encounter: Secondary | ICD-10-CM

## 2024-06-05 DIAGNOSIS — M79642 Pain in left hand: Secondary | ICD-10-CM

## 2024-06-05 MED ORDER — IBUPROFEN 800 MG PO TABS: 800.0000 mg | ORAL_TABLET | Freq: Three times a day (TID) | ORAL | 0 refills | Status: AC

## 2024-06-05 MED ORDER — SILVER SULFADIAZINE 1 % EX CREA: 1.0000 | TOPICAL_CREAM | Freq: Every day | CUTANEOUS | 0 refills | Status: AC
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
## 2024-06-05 NOTE — Discharge Instructions (Signed)
 Keep the dressing on today until tonight. Remove the dressing then, wash with soap and water. If you see blisters then continue using Silvadene  cream to the area, changing your dressing 2-3 times daily until the skin sloughs off and repairs. This process would usually take 1-2 weeks. Otherwise, if there are no blisters that form, I recommend using over-the-counter Aquaphor or A&D ointment to help protect the skin.
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
